# Patient Record
Sex: Female | Born: 1955 | Race: White | Hispanic: No | Smoking: Former smoker
Health system: Southern US, Community
[De-identification: ages and names within clinical notes are randomized; demographics above are authoritative.]

## PROBLEM LIST (undated history)

## (undated) DIAGNOSIS — K219 Gastro-esophageal reflux disease without esophagitis: Secondary | ICD-10-CM

## (undated) DIAGNOSIS — R7303 Prediabetes: Secondary | ICD-10-CM

## (undated) DIAGNOSIS — G4700 Insomnia, unspecified: Secondary | ICD-10-CM

## (undated) DIAGNOSIS — F419 Anxiety disorder, unspecified: Secondary | ICD-10-CM

## (undated) HISTORY — DX: Anxiety disorder, unspecified: F41.9

## (undated) HISTORY — PX: TONSILLECTOMY: SUR1361

## (undated) HISTORY — DX: Insomnia, unspecified: G47.00

## (undated) HISTORY — DX: Morbid (severe) obesity due to excess calories: E66.01

---

## 2000-10-01 ENCOUNTER — Other Ambulatory Visit: Admission: RE | Admit: 2000-10-01 | Discharge: 2000-10-01 | Payer: Self-pay | Admitting: Obstetrics and Gynecology

## 2000-11-25 ENCOUNTER — Encounter: Payer: Self-pay | Admitting: Family Medicine

## 2000-11-25 ENCOUNTER — Ambulatory Visit (HOSPITAL_COMMUNITY): Admission: RE | Admit: 2000-11-25 | Discharge: 2000-11-25 | Payer: Self-pay | Admitting: Family Medicine

## 2001-10-21 ENCOUNTER — Encounter: Payer: Self-pay | Admitting: Family Medicine

## 2001-10-21 ENCOUNTER — Ambulatory Visit (HOSPITAL_COMMUNITY): Admission: RE | Admit: 2001-10-21 | Discharge: 2001-10-21 | Payer: Self-pay | Admitting: Family Medicine

## 2004-03-03 ENCOUNTER — Ambulatory Visit (HOSPITAL_COMMUNITY): Admission: RE | Admit: 2004-03-03 | Discharge: 2004-03-03 | Payer: Self-pay | Admitting: Family Medicine

## 2004-05-12 ENCOUNTER — Ambulatory Visit (HOSPITAL_COMMUNITY): Admission: RE | Admit: 2004-05-12 | Discharge: 2004-05-12 | Payer: Self-pay | Admitting: Allergy and Immunology

## 2004-06-07 ENCOUNTER — Ambulatory Visit (HOSPITAL_COMMUNITY): Admission: RE | Admit: 2004-06-07 | Discharge: 2004-06-07 | Payer: Self-pay | Admitting: Family Medicine

## 2004-08-08 ENCOUNTER — Ambulatory Visit (HOSPITAL_COMMUNITY): Admission: RE | Admit: 2004-08-08 | Discharge: 2004-08-08 | Payer: Self-pay | Admitting: Family Medicine

## 2006-04-25 ENCOUNTER — Ambulatory Visit (HOSPITAL_COMMUNITY): Admission: RE | Admit: 2006-04-25 | Discharge: 2006-04-25 | Payer: Self-pay | Admitting: Family Medicine

## 2006-05-08 ENCOUNTER — Encounter: Admission: RE | Admit: 2006-05-08 | Discharge: 2006-05-08 | Payer: Self-pay | Admitting: Family Medicine

## 2007-10-16 ENCOUNTER — Ambulatory Visit (HOSPITAL_COMMUNITY): Admission: RE | Admit: 2007-10-16 | Discharge: 2007-10-16 | Payer: Self-pay | Admitting: Family Medicine

## 2009-02-16 ENCOUNTER — Ambulatory Visit (HOSPITAL_COMMUNITY): Admission: RE | Admit: 2009-02-16 | Discharge: 2009-02-16 | Payer: Self-pay | Admitting: Family Medicine

## 2010-02-17 ENCOUNTER — Ambulatory Visit (HOSPITAL_COMMUNITY): Admission: RE | Admit: 2010-02-17 | Discharge: 2010-02-17 | Payer: Self-pay | Admitting: Family Medicine

## 2011-04-04 ENCOUNTER — Other Ambulatory Visit (HOSPITAL_COMMUNITY): Payer: Self-pay | Admitting: Family Medicine

## 2011-04-04 DIAGNOSIS — Z139 Encounter for screening, unspecified: Secondary | ICD-10-CM

## 2011-04-17 ENCOUNTER — Ambulatory Visit (HOSPITAL_COMMUNITY)
Admission: RE | Admit: 2011-04-17 | Discharge: 2011-04-17 | Disposition: A | Discharge status: Home / Self Care | Payer: 59 | Source: Ambulatory Visit | Attending: Family Medicine | Admitting: Family Medicine

## 2011-04-17 DIAGNOSIS — Z139 Encounter for screening, unspecified: Secondary | ICD-10-CM

## 2011-04-17 DIAGNOSIS — Z1231 Encounter for screening mammogram for malignant neoplasm of breast: Secondary | ICD-10-CM | POA: Insufficient documentation

## 2011-04-24 ENCOUNTER — Other Ambulatory Visit: Payer: Self-pay | Admitting: Family Medicine

## 2011-04-24 DIAGNOSIS — R928 Other abnormal and inconclusive findings on diagnostic imaging of breast: Secondary | ICD-10-CM

## 2011-05-16 ENCOUNTER — Ambulatory Visit (HOSPITAL_COMMUNITY)
Admission: RE | Admit: 2011-05-16 | Discharge: 2011-05-16 | Disposition: A | Discharge status: Home / Self Care | Payer: 59 | Source: Ambulatory Visit | Attending: Family Medicine | Admitting: Family Medicine

## 2011-05-16 DIAGNOSIS — R928 Other abnormal and inconclusive findings on diagnostic imaging of breast: Secondary | ICD-10-CM | POA: Insufficient documentation

## 2011-08-02 ENCOUNTER — Telehealth: Payer: Self-pay

## 2011-08-02 NOTE — Telephone Encounter (Signed)
LMOM to call.

## 2011-08-07 NOTE — Telephone Encounter (Signed)
Called, LMOM to call. Will mail letter also.

## 2011-08-09 ENCOUNTER — Telehealth: Payer: Self-pay

## 2011-08-09 ENCOUNTER — Other Ambulatory Visit: Payer: Self-pay

## 2011-08-09 DIAGNOSIS — Z139 Encounter for screening, unspecified: Secondary | ICD-10-CM

## 2011-08-09 NOTE — Telephone Encounter (Signed)
Placed on schedule for colonoscopy on 08/17/2011 @ 1:15 PM. Orders in computer. Pt will need prescription and instructions on 08/16/2011 at her OV with Dr. Darrick Penna.

## 2011-08-09 NOTE — Telephone Encounter (Signed)
Pt says about once a month she has an episode with loose/yellowish stool. She requests to see Dr. Darrick Penna. I have scheduled OV with Dr. Darrick Penna on 08/16/2011 at 9:00 AM. Pt said she would like to have the colonoscopy the next day on 08/17/2011 if possible because of a point system at work.

## 2011-08-09 NOTE — Telephone Encounter (Signed)
Put pt on schedule for TCS 2/8

## 2011-08-14 ENCOUNTER — Encounter: Payer: Self-pay | Admitting: Gastroenterology

## 2011-08-14 ENCOUNTER — Encounter (HOSPITAL_COMMUNITY): Payer: Self-pay | Admitting: Pharmacy Technician

## 2011-08-16 ENCOUNTER — Encounter: Payer: Self-pay | Admitting: Gastroenterology

## 2011-08-16 ENCOUNTER — Ambulatory Visit (INDEPENDENT_AMBULATORY_CARE_PROVIDER_SITE_OTHER): Payer: 59 | Admitting: Gastroenterology

## 2011-08-16 VITALS — BP 140/82 | HR 63 | Temp 98.1°F | Ht 65.0 in | Wt 245.0 lb

## 2011-08-16 DIAGNOSIS — K591 Functional diarrhea: Secondary | ICD-10-CM

## 2011-08-16 MED ORDER — PEG-KCL-NACL-NASULF-NA ASC-C 100 G PO SOLR: 1.0000 | Freq: Once | ORAL | Status: DC

## 2011-08-16 MED ORDER — SODIUM CHLORIDE 0.45 % IV SOLN
Freq: Once | INTRAVENOUS | Status: AC
  Administered 2011-08-17: 13:00:00 via INTRAVENOUS

## 2011-08-16 NOTE — Progress Notes (Signed)
  Subjective:    Patient ID: Janet Luna, female    DOB: 10/03/1955, 55 y.o.   MRN: [id number]  PCP: [facility]  HPI NOV ATE LUNCH AROUND 1230 AND THEN AROUND 130 OR 2 PM WOULD HAVE EXPLOSIVE DIARRHEA. Started in NOV. Can get swimmy headed-NOV 27, DEC 14, JAN 22, & JAN 25-26. Can be associated with fever blisters. Doesn't know what causes it. "Could be her nerves". Yesterday ate cerael, nabs, & OJ. Came in from work ran to [employer]. cUP 1% MILK EVERY am, CHEESE: 1X/WEEK, ICE CREAM: NO. nO CRAMPS, NAUSEA, OR VOMITING. No blood in stool, black tarry stools, weight loss. Appetite: fine. No problems swallowing, or heartburn or indigestion. If she eats sweets or late at night she has acid reflux. Took ABx over a week ago: LEVAQUIN. None in in DEC or JAN. No IBS Dx. Sometime gets bad for anxiety-meds to sleep. No episode related to stress: watching over mother w/i last year and granddaughter since she was 2 mos old. WORKING HARD AT [employer]. NO BLOATING. In between episode nl formed stool. No joint pain or swollen joints. Problems with kidneys-if she sneezes, loses control of bladder. HAS HAD 3 KIDS: DAUGHTER AND TWIN BOYS.  Past Medical History  Diagnosis Date  . Allergic rhinitis   . Anxiety   . Insomnia   . Morbid obesity     Past Surgical History  Procedure Date  . Tonsillectomy     age 15  . Cesarean section     twin 28 yrs ago    No Known Allergies  Current Outpatient Prescriptions  Medication Sig Dispense Refill  . clorazepate (TRANXENE) 7.5 MG tablet Take 7.5 mg by mouth 2 (two) times daily.      . ibuprofen (ADVIL,MOTRIN) 200 MG tablet Take 400 mg by mouth every 6 (six) hours as needed. For pain      . traZODone (DESYREL) 100 MG tablet Take 100 mg by mouth at bedtime.       Family History  Problem Relation Age of Onset  . Colon cancer Neg Hx   . Colon polyps Neg Hx     History  Substance Use Topics  . Smoking status: Former Smoker -- 0.5 packs/day    Types: Cigarettes  .  Smokeless tobacco: Not on file   Comment: quit about 20 yrs ago  . Alcohol Use: No      Review of Systems  All other systems reviewed and are negative.       Objective:   Physical Exam  Vitals reviewed. Constitutional: She is oriented to person, place, and time. She appears well-developed. No distress.  HENT:  Head: Normocephalic and atraumatic.  Mouth/Throat: No oropharyngeal exudate.  Eyes: Pupils are equal, round, and reactive to light. No scleral icterus.  Neck: Normal range of motion. Neck supple.  Cardiovascular: Normal rate, regular rhythm and normal heart sounds.   Pulmonary/Chest: Effort normal and breath sounds normal. No respiratory distress.  Abdominal: Soft. Bowel sounds are normal. She exhibits no distension. There is no tenderness.  Musculoskeletal: Normal range of motion. She exhibits no edema.  Lymphadenopathy:    She has no cervical adenopathy.  Neurological: She is alert and oriented to person, place, and time.  Psychiatric:       SLIGHTLY ANXIOUS MOOD          Assessment & Plan:   

## 2011-08-16 NOTE — Assessment & Plan Note (Addendum)
DISCUSSED DIAGNOSIS, PROCEDURE, & BENEFITS.  WILL NEED COLON bX FOR MICROSCOPIC COLITIS. TCS FEB 8. OPV IN 6 MOS.  TOTAL TIME FOR h/pe/ & DISCUSSION OF PROCEDURE AND MANAGEMENT-60 MINS.

## 2011-08-17 ENCOUNTER — Encounter (HOSPITAL_COMMUNITY)
Admission: RE | Disposition: A | Discharge status: Home / Self Care | Payer: Self-pay | Source: Ambulatory Visit | Attending: Gastroenterology

## 2011-08-17 ENCOUNTER — Ambulatory Visit (HOSPITAL_COMMUNITY)
Admission: RE | Admit: 2011-08-17 | Discharge: 2011-08-17 | Disposition: A | Discharge status: Home / Self Care | Payer: 59 | Source: Ambulatory Visit | Attending: Gastroenterology | Admitting: Gastroenterology

## 2011-08-17 ENCOUNTER — Encounter (HOSPITAL_COMMUNITY): Payer: Self-pay | Admitting: *Deleted

## 2011-08-17 ENCOUNTER — Other Ambulatory Visit: Payer: Self-pay | Admitting: Gastroenterology

## 2011-08-17 DIAGNOSIS — K621 Rectal polyp: Secondary | ICD-10-CM

## 2011-08-17 DIAGNOSIS — K5730 Diverticulosis of large intestine without perforation or abscess without bleeding: Secondary | ICD-10-CM

## 2011-08-17 DIAGNOSIS — D128 Benign neoplasm of rectum: Secondary | ICD-10-CM | POA: Insufficient documentation

## 2011-08-17 DIAGNOSIS — D129 Benign neoplasm of anus and anal canal: Secondary | ICD-10-CM | POA: Insufficient documentation

## 2011-08-17 DIAGNOSIS — K648 Other hemorrhoids: Secondary | ICD-10-CM

## 2011-08-17 DIAGNOSIS — K620 Anal polyp: Secondary | ICD-10-CM

## 2011-08-17 DIAGNOSIS — Z139 Encounter for screening, unspecified: Secondary | ICD-10-CM

## 2011-08-17 DIAGNOSIS — Z1211 Encounter for screening for malignant neoplasm of colon: Secondary | ICD-10-CM

## 2011-08-17 HISTORY — PX: COLONOSCOPY: SHX5424

## 2011-08-17 HISTORY — DX: Gastro-esophageal reflux disease without esophagitis: K21.9

## 2011-08-17 SURGERY — COLONOSCOPY
Anesthesia: Moderate Sedation

## 2011-08-17 MED ORDER — MEPERIDINE HCL 100 MG/ML IJ SOLN
INTRAMUSCULAR | Status: DC | PRN
  Administered 2011-08-17 (×2): 25 mg via INTRAVENOUS
  Administered 2011-08-17: 50 mg via INTRAVENOUS

## 2011-08-17 MED ORDER — MIDAZOLAM HCL 5 MG/5ML IJ SOLN
INTRAMUSCULAR | Status: AC
  Filled 2011-08-17: qty 10

## 2011-08-17 MED ORDER — MIDAZOLAM HCL 5 MG/5ML IJ SOLN
INTRAMUSCULAR | Status: DC | PRN
  Administered 2011-08-17 (×2): 2 mg via INTRAVENOUS
  Administered 2011-08-17: 1 mg via INTRAVENOUS

## 2011-08-17 MED ORDER — STERILE WATER FOR IRRIGATION IR SOLN
Status: DC | PRN
  Administered 2011-08-17: 13:00:00

## 2011-08-17 MED ORDER — MEPERIDINE HCL 100 MG/ML IJ SOLN
INTRAMUSCULAR | Status: AC
  Filled 2011-08-17: qty 2

## 2011-08-17 NOTE — Progress Notes (Signed)
Faxed to PCP

## 2011-08-17 NOTE — Interval H&P Note (Signed)
History and Physical Interval Note:  08/17/2011 12:16 PM  Janet Luna  has presented today for surgery, with the diagnosis of Screening  The various methods of treatment have been discussed with the patient and family. After consideration of risks, benefits and other options for treatment, the patient has consented to  Procedure(s): COLONOSCOPY as a surgical intervention .  The patients' history has been reviewed, patient examined, no change in status, stable for surgery.  I have reviewed the patients' chart and labs.  Questions were answered to the patient's satisfaction.     [employer]

## 2011-08-17 NOTE — Op Note (Addendum)
[facility] [address] [place], [place]  [address]  COLONOSCOPY PROCEDURE REPORT  PATIENT:  Janet Luna, Janet Luna  MR#:  [id number] BIRTHDATE:  10-13-1955, 55 yrs. old  GENDER:  female  ENDOSCOPIST:  Jonette Eva, MD REF. BY:  Gareth Morgan, M.D. ASSISTANT:  PROCEDURE DATE:  08/17/2011 PROCEDURE:  ILEOColonoscopy with biopsy  INDICATIONS:  SCREENING  MEDICATIONS:   Demerol 100 mg IV, Versed 5 mg IV  DESCRIPTION OF PROCEDURE:    Physical exam was performed. Informed consent was obtained from the patient after explaining the benefits, risks, and alternatives to procedure.  The patient was connected to monitor and placed in left lateral position. Continuous oxygen was provided by nasal cannula and IV medicine administered through an indwelling cannula.  After administration of sedation and rectal exam, the patient's rectum was intubated and the EC-[id number] ([id number]) colonoscope was advanced under direct visualization to the ILEUM.  The scope was removed slowly by carefully examining the color, texture, anatomy, and integrity mucosa on the way out.  The patient was recovered in endoscopy and discharged home in satisfactory condition. <<PROCEDUREIMAGES>>  FINDINGS:  Scattered diverticula were found ascending colon to sigmoid colon.  SMALL Internal Hemorrhoids were found.  There were 3 SESSILE polyps identified and removed. in the rectum VIA COLD FORCEPS. RANDOM BIOPSIES OBTAINED VIA COLD FORCEPS TO EVALUATE FOR MICROSCOPIC OLITIS.  NL ILEUM  10 CM VISUALIZED.  PREP QUALITY: GOOD CECAL W/D TIME:    12 Minutes  COMPLICATIONS:    None  ENDOSCOPIC IMPRESSION: 1) Internal hemorrhoids 2) Polyps, multiple in the rectum 3) MILD PAN-COLONIC DIVERTICULOSIS  RECOMMENDATIONS: TCS IN 10 YEARS HIGH FIBER DIET AWAIT BIOPSIES  REPEAT EXAM:  No  ______________________________ Jonette Eva, MD  CC:  Gareth Morgan, M.D.  n. REVISED:  08/30/2011 01:25 PM eSIGNED:   Duncan Dull  Fields at 08/30/2011 01:25 PM  Charisse March, [phone]

## 2011-08-17 NOTE — H&P (View-Only) (Signed)
  Subjective:    Patient ID: Janet Luna, female    DOB: 17-Aug-1955, 56 y.o.   MRN: [id number]  PCP: [facility]  HPI NOV ATE LUNCH AROUND 1230 AND THEN AROUND 130 OR 2 PM WOULD HAVE EXPLOSIVE DIARRHEA. Started in NOV. Can get swimmy headed-NOV 27, DEC 14, JAN 22, & JAN 25-26. Can be associated with fever blisters. Doesn't know what causes it. "Could be her nerves". Yesterday ate cerael, nabs, & OJ. Came in from work ran to [employer]. cUP 1% MILK EVERY am, CHEESE: 1X/WEEK, ICE CREAM: NO. nO CRAMPS, NAUSEA, OR VOMITING. No blood in stool, black tarry stools, weight loss. Appetite: fine. No problems swallowing, or heartburn or indigestion. If she eats sweets or late at night she has acid reflux. Took ABx over a week ago: LEVAQUIN. None in in DEC or JAN. No IBS Dx. Sometime gets bad for anxiety-meds to sleep. No episode related to stress: watching over mother w/i last year and granddaughter since she was 29 mos old. WORKING HARD AT [employer]. NO BLOATING. In between episode nl formed stool. No joint pain or swollen joints. Problems with kidneys-if she sneezes, loses control of bladder. HAS HAD 3 KIDS: DAUGHTER AND TWIN BOYS.  Past Medical History  Diagnosis Date  . Allergic rhinitis   . Anxiety   . Insomnia   . Morbid obesity     Past Surgical History  Procedure Date  . Tonsillectomy     age 65  . Cesarean section     twin 28 yrs ago    No Known Allergies  Current Outpatient Prescriptions  Medication Sig Dispense Refill  . clorazepate (TRANXENE) 7.5 MG tablet Take 7.5 mg by mouth 2 (two) times daily.      Marland Kitchen ibuprofen (ADVIL,MOTRIN) 200 MG tablet Take 400 mg by mouth every 6 (six) hours as needed. For pain      . traZODone (DESYREL) 100 MG tablet Take 100 mg by mouth at bedtime.       Family History  Problem Relation Age of Onset  . Colon cancer Neg Hx   . Colon polyps Neg Hx     History  Substance Use Topics  . Smoking status: Former Smoker -- 0.5 packs/day    Types: Cigarettes  .  Smokeless tobacco: Not on file   Comment: quit about 20 yrs ago  . Alcohol Use: No      Review of Systems  All other systems reviewed and are negative.       Objective:   Physical Exam  Vitals reviewed. Constitutional: She is oriented to person, place, and time. She appears well-developed. No distress.  HENT:  Head: Normocephalic and atraumatic.  Mouth/Throat: No oropharyngeal exudate.  Eyes: Pupils are equal, round, and reactive to light. No scleral icterus.  Neck: Normal range of motion. Neck supple.  Cardiovascular: Normal rate, regular rhythm and normal heart sounds.   Pulmonary/Chest: Effort normal and breath sounds normal. No respiratory distress.  Abdominal: Soft. Bowel sounds are normal. She exhibits no distension. There is no tenderness.  Musculoskeletal: Normal range of motion. She exhibits no edema.  Lymphadenopathy:    She has no cervical adenopathy.  Neurological: She is alert and oriented to person, place, and time.  Psychiatric:       SLIGHTLY ANXIOUS MOOD          Assessment & Plan:

## 2011-08-23 ENCOUNTER — Telehealth: Payer: Self-pay | Admitting: Gastroenterology

## 2011-08-23 NOTE — Telephone Encounter (Signed)
Please call pt. She had HYPERPLASTIC POLYPS removed from her colon. TCS in 10 years. High fiber diet.  

## 2011-08-23 NOTE — Telephone Encounter (Signed)
Results Cc to PCP & 10yr reminder is in the computer 

## 2011-08-23 NOTE — Telephone Encounter (Signed)
LMOM to call.

## 2011-08-27 ENCOUNTER — Encounter (HOSPITAL_COMMUNITY): Payer: Self-pay | Admitting: Gastroenterology

## 2011-08-27 NOTE — Telephone Encounter (Signed)
LMOM to call.

## 2011-08-27 NOTE — Telephone Encounter (Signed)
Pt returned call and was informed.  

## 2011-08-27 NOTE — Progress Notes (Signed)
Reminder in epic to follow up in 6 months with SF in E30 °

## 2012-02-07 ENCOUNTER — Encounter: Payer: Self-pay | Admitting: Gastroenterology

## 2012-11-04 DIAGNOSIS — Z029 Encounter for administrative examinations, unspecified: Secondary | ICD-10-CM

## 2012-12-15 ENCOUNTER — Other Ambulatory Visit (HOSPITAL_COMMUNITY): Payer: Self-pay | Admitting: Family Medicine

## 2012-12-15 DIAGNOSIS — Z139 Encounter for screening, unspecified: Secondary | ICD-10-CM

## 2012-12-18 ENCOUNTER — Ambulatory Visit (HOSPITAL_COMMUNITY)
Admission: RE | Admit: 2012-12-18 | Discharge: 2012-12-18 | Disposition: A | Discharge status: Home / Self Care | Payer: 59 | Source: Ambulatory Visit | Attending: Family Medicine | Admitting: Family Medicine

## 2012-12-18 DIAGNOSIS — Z1231 Encounter for screening mammogram for malignant neoplasm of breast: Secondary | ICD-10-CM | POA: Insufficient documentation

## 2012-12-18 DIAGNOSIS — Z139 Encounter for screening, unspecified: Secondary | ICD-10-CM

## 2013-05-25 ENCOUNTER — Encounter (HOSPITAL_COMMUNITY): Payer: Self-pay | Admitting: Emergency Medicine

## 2013-05-25 ENCOUNTER — Emergency Department (HOSPITAL_COMMUNITY)
Admission: EM | Admit: 2013-05-25 | Discharge: 2013-05-25 | Disposition: A | Discharge status: Home / Self Care | Payer: 59 | Attending: Emergency Medicine | Admitting: Emergency Medicine

## 2013-05-25 DIAGNOSIS — R51 Headache: Secondary | ICD-10-CM | POA: Insufficient documentation

## 2013-05-25 DIAGNOSIS — Z87891 Personal history of nicotine dependence: Secondary | ICD-10-CM | POA: Insufficient documentation

## 2013-05-25 DIAGNOSIS — R0789 Other chest pain: Secondary | ICD-10-CM | POA: Insufficient documentation

## 2013-05-25 DIAGNOSIS — R490 Dysphonia: Secondary | ICD-10-CM | POA: Insufficient documentation

## 2013-05-25 DIAGNOSIS — Z8719 Personal history of other diseases of the digestive system: Secondary | ICD-10-CM | POA: Insufficient documentation

## 2013-05-25 DIAGNOSIS — L299 Pruritus, unspecified: Secondary | ICD-10-CM | POA: Insufficient documentation

## 2013-05-25 DIAGNOSIS — F411 Generalized anxiety disorder: Secondary | ICD-10-CM | POA: Insufficient documentation

## 2013-05-25 DIAGNOSIS — G4700 Insomnia, unspecified: Secondary | ICD-10-CM | POA: Insufficient documentation

## 2013-05-25 DIAGNOSIS — T7840XA Allergy, unspecified, initial encounter: Secondary | ICD-10-CM

## 2013-05-25 DIAGNOSIS — R197 Diarrhea, unspecified: Secondary | ICD-10-CM | POA: Insufficient documentation

## 2013-05-25 DIAGNOSIS — T3695XA Adverse effect of unspecified systemic antibiotic, initial encounter: Secondary | ICD-10-CM | POA: Insufficient documentation

## 2013-05-25 DIAGNOSIS — Z79899 Other long term (current) drug therapy: Secondary | ICD-10-CM | POA: Insufficient documentation

## 2013-05-25 NOTE — ED Notes (Signed)
After taking Levoquin today began having itching on top of her head and chest pressure. Has had history of reaction to Avelox in the past, resulting in hives which were controlled w/PO Benadryl.  States HA from earlier is relieved along w/itching at present; however, states her chest "feels funny".  Voice is hoarse.  Patient denies and cough, sore throat or post nasal drip.

## 2013-05-25 NOTE — ED Notes (Signed)
Pt states she was recently placed on Levaquin for sinus infection. States she took two and stopped (filled 11/5). States headache all week so she took another Levaquin this morning along with two get advil. Pt states she began itching to the scalp and tight sensation to throat area. States it is feeling a little better at present. NAD.

## 2013-05-25 NOTE — ED Provider Notes (Signed)
CSN: [id number]     Arrival date & time 05/25/13  1038 History  This chart was scribed for Joya Gaskins, MD by Bennett Scrape, ED Scribe. This patient was seen in room [facility]06[facility] and the patient's care was started at 12:01 PM.   Chief Complaint  Patient presents with  . Allergic Reaction    Patient is a 57 y.o. female presenting with allergic reaction. The history is provided by the patient. No language interpreter was used.  Allergic Reaction Presenting symptoms: itching   Presenting symptoms: no difficulty swallowing and no rash   Severity:  Mild Prior allergic episodes:  No prior episodes Relieved by:  Antihistamines Worsened by:  Nothing tried Ineffective treatments:  None tried   HPI Comments: Janet Luna is a 57 y.o. female who presents to the Emergency Department complaining of a sudden onset, gradually improving allergic reaction to Levaquin that started 10 minutes after taking it. Pt states that she was recently placed on Levaquin for a sinus infection. She got the medication filled on 05/13/13 and took two doses. She stopped due to having diarrhea which she attributes to IBS. She then resumed her Levaquin doses this morning coupled with two Advil gel tabs when she developed scalp itching, chest tightness and hoarse voice which have since improved with 25 mg Benadryl. She has taken Levaquin before with no problems and denies any new exposures. She also reports a HA for the past few days. She states that it is dull currently and improved. She denies any extremity weakness or numbness, cough, rhinorrhea or visual changes. She reports a prior colonoscopy several years ago but has been having recurrent diarrhea.    Past Medical History  Diagnosis Date  . Allergic rhinitis   . Anxiety   . Insomnia   . Morbid obesity   . GERD (gastroesophageal reflux disease)    Past Surgical History  Procedure Laterality Date  . Tonsillectomy      age 66  . Cesarean section      twin  28 yrs ago  . Colonoscopy  08/17/2011    Procedure: COLONOSCOPY;  Surgeon: Arlyce Harman, MD;  Location: [facility];  Service: Endoscopy;  Laterality: N/A;  1:15 PM   Family History  Problem Relation Age of Onset  . Colon cancer Neg Hx   . Colon polyps Neg Hx    History  Substance Use Topics  . Smoking status: Former Smoker -- 0.50 packs/day for 12 years    Types: Cigarettes  . Smokeless tobacco: Not on file     Comment: quit about 20 yrs ago  . Alcohol Use: No   No OB history provided.  Review of Systems  HENT: Positive for voice change. Negative for rhinorrhea and trouble swallowing.   Respiratory: Negative for cough and shortness of breath.   Gastrointestinal: Positive for diarrhea. Negative for abdominal pain.  Skin: Positive for itching. Negative for rash.  Neurological: Positive for headaches.  All other systems reviewed and are negative.    Allergies  Avelox  Home Medications   Current Outpatient Rx  Name  Route  Sig  Dispense  Refill  . clorazepate (TRANXENE) 7.5 MG tablet   Oral   Take 7.5 mg by mouth 2 (two) times daily.         Marland Kitchen ibuprofen (ADVIL,MOTRIN) 200 MG tablet   Oral   Take 400 mg by mouth every 6 (six) hours as needed. For pain         .  traZODone (DESYREL) 100 MG tablet   Oral   Take 100 mg by mouth at bedtime.          Triage Vitals: BP 154/74  Pulse 64  Temp(Src) 98.4 F (36.9 C) (Oral)  Resp 20  Ht 5\' 4"  (1.626 m)  Wt 230 lb (104.327 kg)  BMI 39.46 kg/m2  SpO2 100%  Physical Exam  Nursing note and vitals reviewed.  CONSTITUTIONAL: Well developed/well nourished HEAD: Normocephalic/atraumatic, no angioedema, no tongue elevation, mild amount of erythema to left cheek, handling secretions EYES: EOMI/PERRL ENMT: Mucous membranes moist, no stridor is noted NECK: supple no meningeal signs CV: S1/S2 noted, no murmurs/rubs/gallops noted LUNGS: Lungs are clear to auscultation bilaterally, no wheezing, no apparent  distress ABDOMEN: soft, nontender, no rebound or guarding NEURO: Pt is awake/alert, moves all extremitiesx4 EXTREMITIES: pulses normal, full ROM SKIN: warm, color normal, no rash noted PSYCH: no abnormalities of mood noted  ED Course  Procedures (including critical care time)  DIAGNOSTIC STUDIES: Oxygen Saturation is 100% on room air, normal by my interpretation.    COORDINATION OF CARE: 11:29 AM-Discussed treatment plan which includes benadryl with pt at bedside and pt agreed to plan. Advised pt to stop the Levaquin and to f/u with PCP for her other symptoms. Pt with mild reaction Stable for d/c home    EKG Interpretation   None       MDM  No diagnosis found. Nursing notes including past medical history and social history reviewed and considered in documentation   I personally performed the services described in this documentation, which was scribed in my presence. The recorded information has been reviewed and is accurate.      Joya Gaskins, MD 05/25/13 [phone]

## 2013-11-11 ENCOUNTER — Other Ambulatory Visit (HOSPITAL_COMMUNITY): Payer: Self-pay | Admitting: Family Medicine

## 2013-11-11 DIAGNOSIS — I8000 Phlebitis and thrombophlebitis of superficial vessels of unspecified lower extremity: Secondary | ICD-10-CM

## 2013-11-12 ENCOUNTER — Ambulatory Visit (HOSPITAL_COMMUNITY)
Admission: RE | Admit: 2013-11-12 | Discharge: 2013-11-12 | Disposition: A | Discharge status: Home / Self Care | Payer: 59 | Source: Ambulatory Visit | Attending: Family Medicine | Admitting: Family Medicine

## 2013-11-12 DIAGNOSIS — I8000 Phlebitis and thrombophlebitis of superficial vessels of unspecified lower extremity: Secondary | ICD-10-CM

## 2013-12-08 ENCOUNTER — Other Ambulatory Visit (HOSPITAL_COMMUNITY): Payer: Self-pay | Admitting: Family Medicine

## 2013-12-08 DIAGNOSIS — Z1231 Encounter for screening mammogram for malignant neoplasm of breast: Secondary | ICD-10-CM

## 2013-12-21 ENCOUNTER — Ambulatory Visit (HOSPITAL_COMMUNITY): Payer: 59

## 2014-04-21 ENCOUNTER — Ambulatory Visit (HOSPITAL_COMMUNITY)
Admission: RE | Admit: 2014-04-21 | Discharge: 2014-04-21 | Disposition: A | Discharge status: Home / Self Care | Payer: BC Managed Care – PPO | Source: Ambulatory Visit | Attending: Family Medicine | Admitting: Family Medicine

## 2014-04-21 DIAGNOSIS — Z1231 Encounter for screening mammogram for malignant neoplasm of breast: Secondary | ICD-10-CM | POA: Diagnosis not present

## 2015-04-25 ENCOUNTER — Other Ambulatory Visit (HOSPITAL_COMMUNITY): Payer: Self-pay | Admitting: Family Medicine

## 2015-04-25 DIAGNOSIS — Z1231 Encounter for screening mammogram for malignant neoplasm of breast: Secondary | ICD-10-CM

## 2015-04-27 ENCOUNTER — Ambulatory Visit (HOSPITAL_COMMUNITY)
Admission: RE | Admit: 2015-04-27 | Discharge: 2015-04-27 | Disposition: A | Discharge status: Home / Self Care | Payer: BLUE CROSS/BLUE SHIELD | Source: Ambulatory Visit | Attending: Family Medicine | Admitting: Family Medicine

## 2015-04-27 DIAGNOSIS — Z1231 Encounter for screening mammogram for malignant neoplasm of breast: Secondary | ICD-10-CM | POA: Diagnosis present

## 2015-05-03 ENCOUNTER — Other Ambulatory Visit: Payer: Self-pay | Admitting: Family Medicine

## 2015-05-03 ENCOUNTER — Other Ambulatory Visit (HOSPITAL_COMMUNITY): Payer: Self-pay | Admitting: Family Medicine

## 2015-05-03 DIAGNOSIS — IMO0002 Reserved for concepts with insufficient information to code with codable children: Secondary | ICD-10-CM

## 2015-05-03 DIAGNOSIS — R229 Localized swelling, mass and lump, unspecified: Principal | ICD-10-CM

## 2015-05-03 DIAGNOSIS — R928 Other abnormal and inconclusive findings on diagnostic imaging of breast: Secondary | ICD-10-CM

## 2015-05-10 ENCOUNTER — Ambulatory Visit
Admission: RE | Admit: 2015-05-10 | Discharge: 2015-05-10 | Disposition: A | Discharge status: Home / Self Care | Payer: BLUE CROSS/BLUE SHIELD | Source: Ambulatory Visit | Attending: Family Medicine | Admitting: Family Medicine

## 2015-05-10 DIAGNOSIS — R928 Other abnormal and inconclusive findings on diagnostic imaging of breast: Secondary | ICD-10-CM

## 2016-04-23 ENCOUNTER — Other Ambulatory Visit (HOSPITAL_COMMUNITY): Payer: Self-pay | Admitting: Family Medicine

## 2016-04-23 DIAGNOSIS — Z1231 Encounter for screening mammogram for malignant neoplasm of breast: Secondary | ICD-10-CM

## 2016-04-27 ENCOUNTER — Ambulatory Visit (HOSPITAL_COMMUNITY)
Admission: RE | Admit: 2016-04-27 | Discharge: 2016-04-27 | Disposition: A | Discharge status: Home / Self Care | Payer: BLUE CROSS/BLUE SHIELD | Source: Ambulatory Visit | Attending: Family Medicine | Admitting: Family Medicine

## 2016-04-27 DIAGNOSIS — Z1231 Encounter for screening mammogram for malignant neoplasm of breast: Secondary | ICD-10-CM | POA: Diagnosis not present

## 2018-12-15 ENCOUNTER — Other Ambulatory Visit (HOSPITAL_COMMUNITY): Payer: Self-pay | Admitting: Family Medicine

## 2018-12-15 DIAGNOSIS — Z1231 Encounter for screening mammogram for malignant neoplasm of breast: Secondary | ICD-10-CM

## 2018-12-18 ENCOUNTER — Ambulatory Visit (HOSPITAL_COMMUNITY)
Admission: RE | Admit: 2018-12-18 | Discharge: 2018-12-18 | Disposition: A | Discharge status: Home / Self Care | Payer: Medicare Other | Source: Ambulatory Visit | Attending: Family Medicine | Admitting: Family Medicine

## 2018-12-18 ENCOUNTER — Other Ambulatory Visit: Payer: Self-pay

## 2018-12-18 DIAGNOSIS — Z1231 Encounter for screening mammogram for malignant neoplasm of breast: Secondary | ICD-10-CM | POA: Diagnosis present

## 2018-12-19 ENCOUNTER — Ambulatory Visit (HOSPITAL_COMMUNITY): Admission: RE | Admit: 2018-12-19 | Payer: Self-pay | Source: Ambulatory Visit

## 2020-08-08 ENCOUNTER — Other Ambulatory Visit (HOSPITAL_COMMUNITY): Payer: Self-pay | Admitting: Family Medicine

## 2020-08-08 DIAGNOSIS — Z1231 Encounter for screening mammogram for malignant neoplasm of breast: Secondary | ICD-10-CM

## 2020-08-26 ENCOUNTER — Other Ambulatory Visit: Payer: Self-pay

## 2020-08-26 ENCOUNTER — Ambulatory Visit (HOSPITAL_COMMUNITY)
Admission: RE | Admit: 2020-08-26 | Discharge: 2020-08-26 | Disposition: A | Discharge status: Home / Self Care | Payer: Medicare Other | Source: Ambulatory Visit | Attending: Family Medicine | Admitting: Family Medicine

## 2020-08-26 DIAGNOSIS — Z1231 Encounter for screening mammogram for malignant neoplasm of breast: Secondary | ICD-10-CM | POA: Diagnosis present

## 2020-08-31 ENCOUNTER — Ambulatory Visit (HOSPITAL_COMMUNITY)
Admission: RE | Admit: 2020-08-31 | Discharge: 2020-08-31 | Disposition: A | Discharge status: Home / Self Care | Payer: Medicare Other | Source: Ambulatory Visit | Attending: Family Medicine | Admitting: Family Medicine

## 2020-08-31 ENCOUNTER — Other Ambulatory Visit: Payer: Self-pay | Admitting: Family Medicine

## 2020-08-31 ENCOUNTER — Other Ambulatory Visit (HOSPITAL_COMMUNITY): Payer: Self-pay | Admitting: Family Medicine

## 2020-08-31 ENCOUNTER — Other Ambulatory Visit: Payer: Self-pay

## 2020-08-31 DIAGNOSIS — M79662 Pain in left lower leg: Secondary | ICD-10-CM

## 2020-08-31 DIAGNOSIS — M7989 Other specified soft tissue disorders: Secondary | ICD-10-CM | POA: Diagnosis present

## 2020-09-13 ENCOUNTER — Other Ambulatory Visit (HOSPITAL_COMMUNITY): Payer: Self-pay | Admitting: Family Medicine

## 2020-09-13 ENCOUNTER — Other Ambulatory Visit: Payer: Self-pay | Admitting: Family Medicine

## 2020-09-13 ENCOUNTER — Other Ambulatory Visit: Payer: Self-pay

## 2020-09-13 ENCOUNTER — Ambulatory Visit (HOSPITAL_COMMUNITY)
Admission: RE | Admit: 2020-09-13 | Discharge: 2020-09-13 | Disposition: A | Discharge status: Home / Self Care | Payer: Medicare Other | Source: Ambulatory Visit | Attending: Family Medicine | Admitting: Family Medicine

## 2020-09-13 DIAGNOSIS — M7989 Other specified soft tissue disorders: Secondary | ICD-10-CM | POA: Diagnosis present

## 2020-09-13 DIAGNOSIS — M79662 Pain in left lower leg: Secondary | ICD-10-CM | POA: Insufficient documentation

## 2020-12-09 ENCOUNTER — Other Ambulatory Visit (HOSPITAL_COMMUNITY): Payer: Self-pay | Admitting: Family Medicine

## 2020-12-09 DIAGNOSIS — I8002 Phlebitis and thrombophlebitis of superficial vessels of left lower extremity: Secondary | ICD-10-CM

## 2020-12-14 ENCOUNTER — Ambulatory Visit (HOSPITAL_COMMUNITY): Payer: Medicare Other

## 2020-12-15 ENCOUNTER — Ambulatory Visit (HOSPITAL_COMMUNITY)
Admission: RE | Admit: 2020-12-15 | Discharge: 2020-12-15 | Disposition: A | Discharge status: Home / Self Care | Payer: Medicare Other | Source: Ambulatory Visit | Attending: Family Medicine | Admitting: Family Medicine

## 2020-12-15 DIAGNOSIS — I8002 Phlebitis and thrombophlebitis of superficial vessels of left lower extremity: Secondary | ICD-10-CM | POA: Diagnosis not present

## 2021-08-16 ENCOUNTER — Encounter: Payer: Self-pay | Admitting: *Deleted

## 2021-10-16 ENCOUNTER — Other Ambulatory Visit (HOSPITAL_COMMUNITY): Payer: Self-pay | Admitting: Family Medicine

## 2021-10-16 DIAGNOSIS — Z1231 Encounter for screening mammogram for malignant neoplasm of breast: Secondary | ICD-10-CM

## 2021-10-18 ENCOUNTER — Ambulatory Visit (HOSPITAL_COMMUNITY)
Admission: RE | Admit: 2021-10-18 | Discharge: 2021-10-18 | Disposition: A | Discharge status: Home / Self Care | Payer: Medicare Other | Source: Ambulatory Visit | Attending: Family Medicine | Admitting: Family Medicine

## 2021-10-18 ENCOUNTER — Encounter: Payer: Self-pay | Admitting: *Deleted

## 2021-10-18 DIAGNOSIS — Z1231 Encounter for screening mammogram for malignant neoplasm of breast: Secondary | ICD-10-CM | POA: Diagnosis present

## 2021-12-13 ENCOUNTER — Encounter: Payer: Self-pay | Admitting: *Deleted

## 2021-12-19 ENCOUNTER — Ambulatory Visit: Payer: Medicare Other

## 2022-06-12 ENCOUNTER — Encounter: Payer: Self-pay | Admitting: *Deleted

## 2022-10-05 ENCOUNTER — Other Ambulatory Visit (HOSPITAL_COMMUNITY): Payer: Self-pay | Admitting: Family Medicine

## 2022-10-05 DIAGNOSIS — Z1231 Encounter for screening mammogram for malignant neoplasm of breast: Secondary | ICD-10-CM

## 2022-10-09 ENCOUNTER — Encounter: Payer: Self-pay | Admitting: *Deleted

## 2022-10-26 ENCOUNTER — Ambulatory Visit (HOSPITAL_COMMUNITY)
Admission: RE | Admit: 2022-10-26 | Discharge: 2022-10-26 | Disposition: A | Discharge status: Home / Self Care | Payer: Medicare Other | Source: Ambulatory Visit | Attending: Family Medicine | Admitting: Family Medicine

## 2022-10-26 DIAGNOSIS — Z1231 Encounter for screening mammogram for malignant neoplasm of breast: Secondary | ICD-10-CM | POA: Diagnosis present

## 2022-11-19 ENCOUNTER — Telehealth: Payer: Self-pay | Admitting: *Deleted

## 2022-11-19 NOTE — Telephone Encounter (Signed)
  Procedure: Colonoscopy  Height: 5'4 Weight: 244lb        Have you had a colonoscopy before?  08/17/11, Dr. Darrick Penna  Do you have family history of colon cancer?  no  Do you have a family history of polyps? no  Previous colonoscopy with polyps removed? yes  Do you have a history colorectal cancer?   no  Are you diabetic?  no  Do you have a prosthetic or mechanical heart valve? no  Do you have a pacemaker/defibrillator?   no  Have you had endocarditis/atrial fibrillation?  no  Do you use supplemental oxygen/CPAP?  no  Have you had joint replacement within the last 12 months?  no  Do you tend to be constipated or have to use laxatives?  no   Do you have history of alcohol use? If yes, how much and how often.  no  Do you have history or are you using drugs? If yes, what do are you  using?  no  Have you ever had a stroke/heart attack?  no  Have you ever had a heart or other vascular stent placed,?no  Do you take weight loss medication? no  female patients,: have you had a hysterectomy? no                              are you post menopausal?  no                              do you still have your menstrual cycle? no    Date of last menstrual period?   Do you take any blood-thinning medications such as: (Plavix, aspirin, Coumadin, Aggrenox, Brilinta, Xarelto, Eliquis, Pradaxa, Savaysa or Effient)? no  If yes we need the name, milligram, dosage and who is prescribing doctor:               Current Outpatient Medications  Medication Sig Dispense Refill   clonazePAM (KLONOPIN) 0.5 MG tablet Take 0.5 mg by mouth in the morning, at noon, and at bedtime.     Cyanocobalamin (B-12 PO) Take by mouth. 2 daily     Famotidine (PEPCID PO) Take by mouth as needed.     Ferrous Sulfate (IRON PO) Take by mouth daily.     ibuprofen (ADVIL,MOTRIN) 200 MG tablet Take 400 mg by mouth every 6 (six) hours as needed. For pain     traZODone (DESYREL) 100 MG tablet Take 100 mg by mouth at bedtime.      No current facility-administered medications for this visit.    Allergies  Allergen Reactions   Quinolones Hives   Avelox [Moxifloxacin Hcl In Nacl] Hives   Levaquin [Levofloxacin In D5w]

## 2022-11-20 ENCOUNTER — Other Ambulatory Visit: Payer: Self-pay | Admitting: *Deleted

## 2022-11-20 ENCOUNTER — Encounter: Payer: Self-pay | Admitting: *Deleted

## 2022-11-20 MED ORDER — PEG 3350-KCL-NA BICARB-NACL 420 G PO SOLR: 4000.0000 mL | Freq: Once | ORAL | 0 refills | Status: AC

## 2022-11-20 NOTE — Telephone Encounter (Signed)
Pt has been scheduled for 12/27/22 with Dr.Carver. instructions mailed and prep sent to the pharmacy

## 2022-11-20 NOTE — Telephone Encounter (Signed)
LMTRC

## 2022-11-20 NOTE — Telephone Encounter (Signed)
Okay to schedule. ASA 3 due to BMI.  Hold iron x 7 days prior to procedure.

## 2022-11-21 ENCOUNTER — Encounter: Payer: Self-pay | Admitting: *Deleted

## 2022-11-21 NOTE — Telephone Encounter (Signed)
Referral completed

## 2022-11-22 ENCOUNTER — Encounter: Payer: Self-pay | Admitting: *Deleted

## 2022-11-27 IMAGING — MG MM DIGITAL SCREENING BILAT W/ TOMO AND CAD
8 series · 8 of 24 positions shown · non-contrast
Comparison: Previous exam(s).

CLINICAL DATA: Screening.

EXAM:
DIGITAL SCREENING BILATERAL MAMMOGRAM WITH TOMOSYNTHESIS AND CAD
TECHNIQUE: Bilateral screening digital craniocaudal and mediolateral oblique
mammograms were obtained. Bilateral screening digital breast
tomosynthesis was performed. The images were evaluated with
computer-aided detection.

[R CC synth-2D]
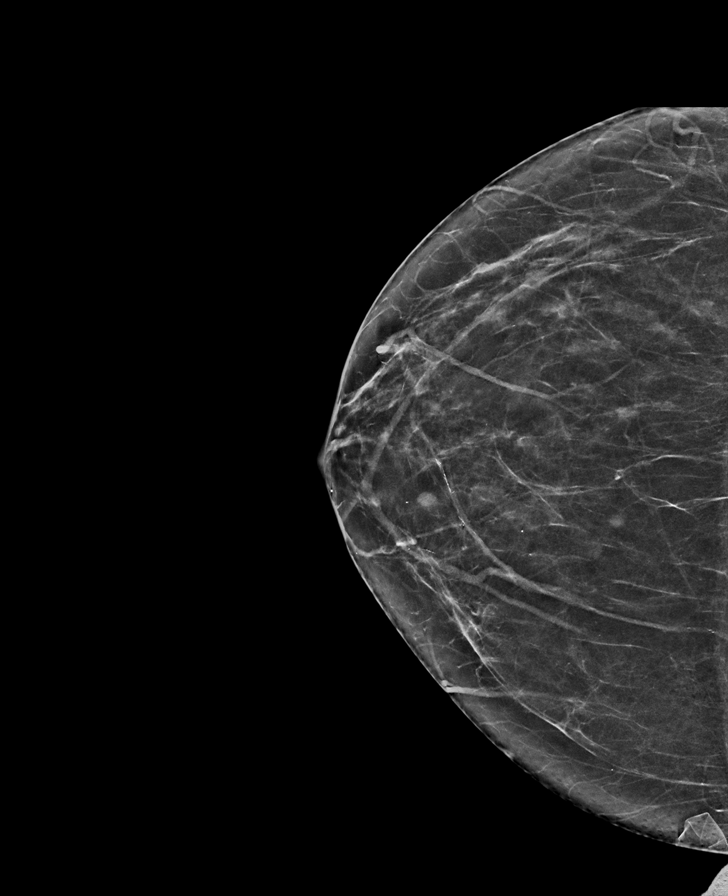

[R MLO synth-2D]
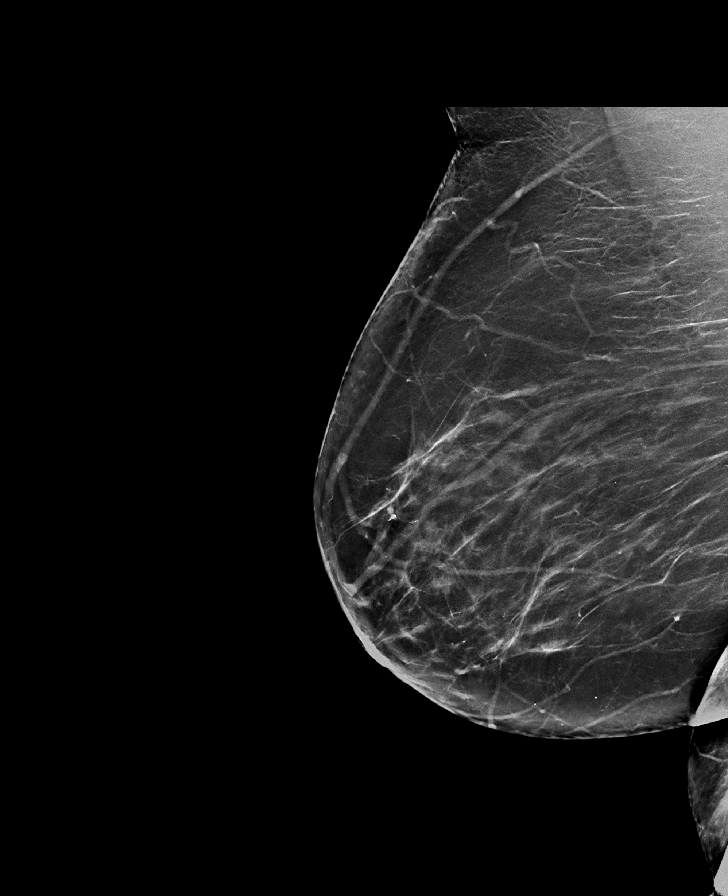

[L MLO synth-2D]
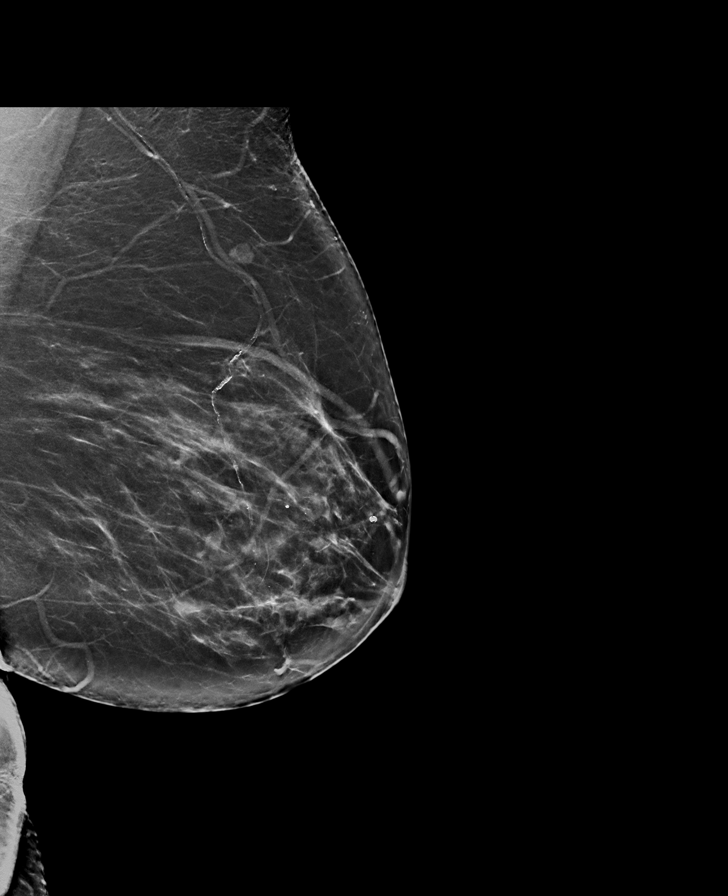

[L CC synth-2D]
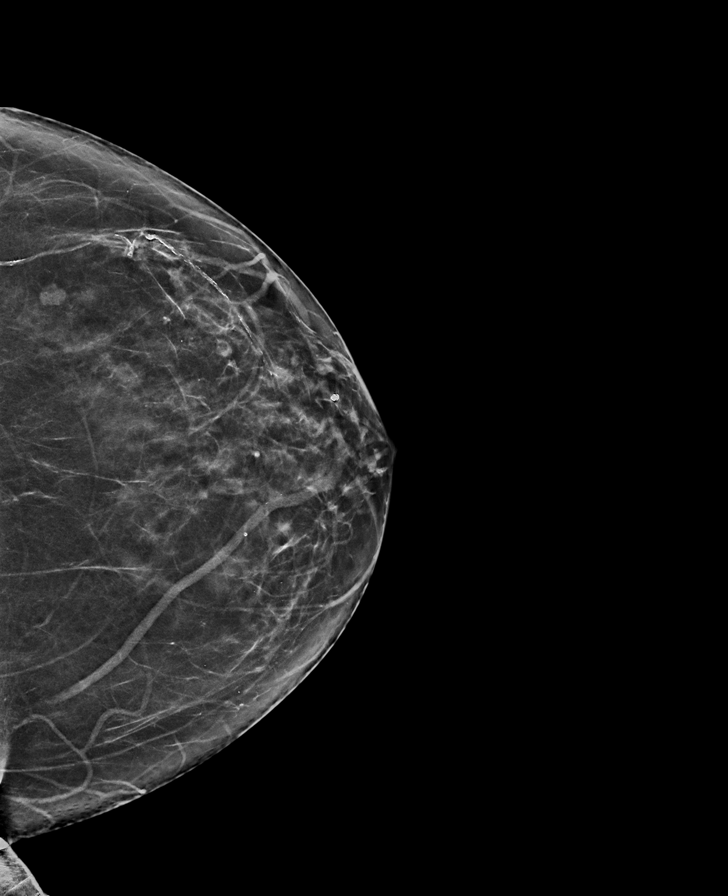

[L CC tomo · tomo slice 34/67.0]
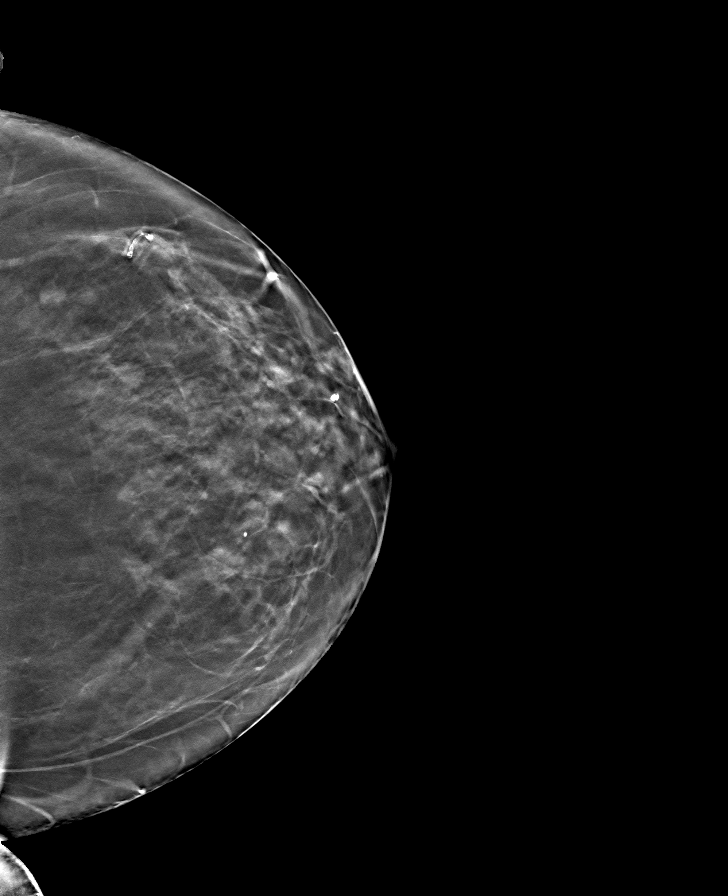

[L MLO tomo · tomo slice 41/80.0]
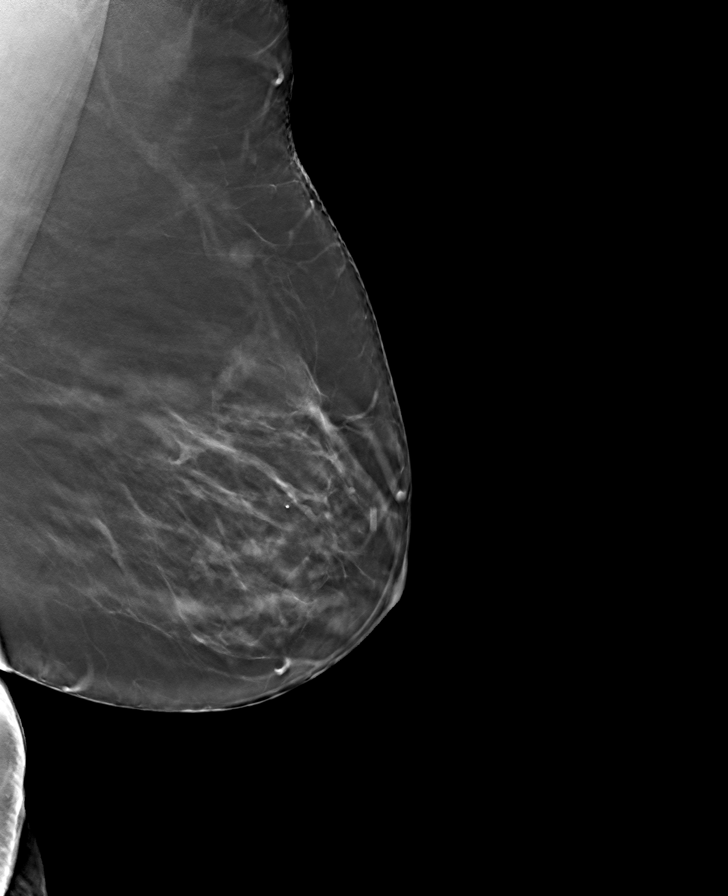

[R MLO tomo · tomo slice 42/83.0]
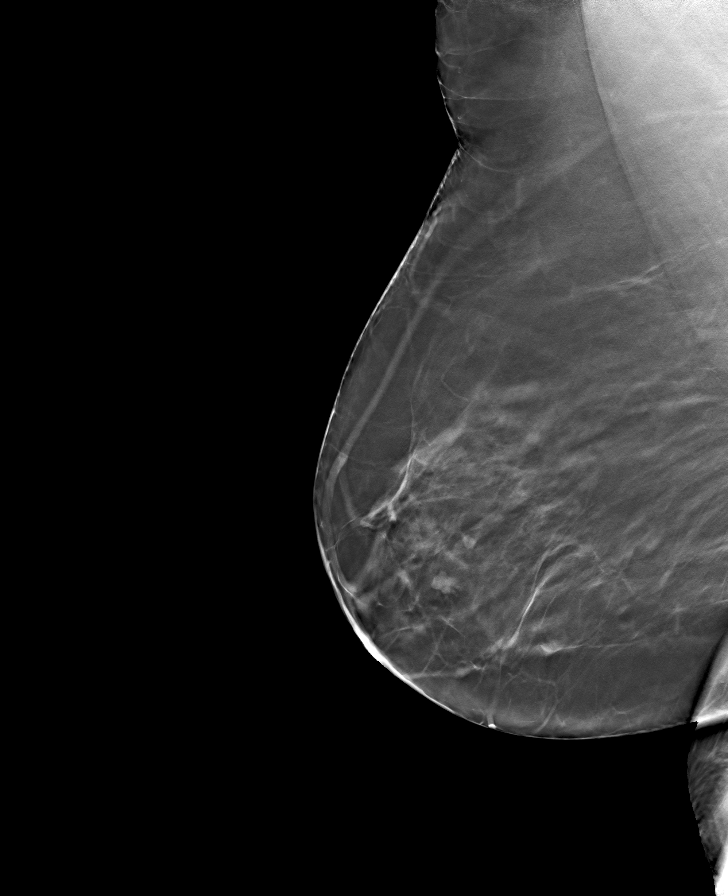

[R CC tomo · tomo slice 31/61.0]
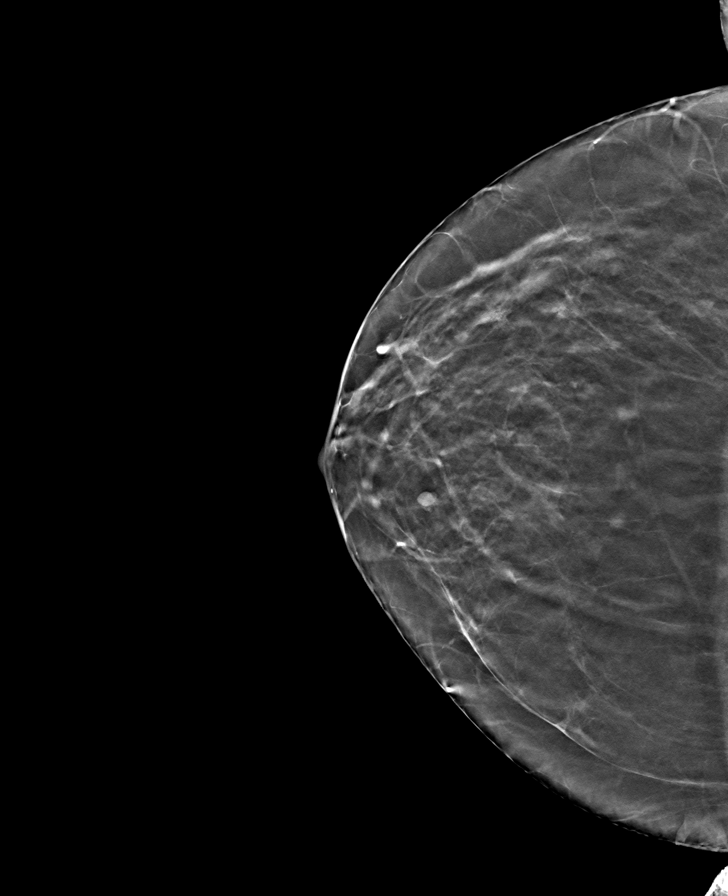

[8 of 24 positions shown; findings below may reference images not displayed]

ACR Breast Density Category b: There are scattered areas of
fibroglandular density.
FINDINGS: There are no findings suspicious for malignancy.
IMPRESSION: No mammographic evidence of malignancy. A result letter of this
screening mammogram will be mailed directly to the patient.

RECOMMENDATION:
Screening mammogram in one year. (Code:51-O-LD2)

BI-RADS CATEGORY  1: Negative.

## 2022-12-02 IMAGING — US US EXTREM LOW VENOUS*L*
1 series · 13 of 24 positions shown · non-contrast
Comparison: None

CLINICAL DATA: LEFT lower leg pain and swelling

EXAM:
LEFT LOWER EXTREMITY VENOUS DOPPLER ULTRASOUND
TECHNIQUE: Gray-scale sonography with compression, as well as color and duplex
ultrasound, were performed to evaluate the deep venous system(s)
from the level of the common femoral vein through the popliteal and
proximal calf veins.

[Series 1: us venous img lower uni left (dvt) · portal-venous · 13 of 40 slices shown]
[im 1/40]
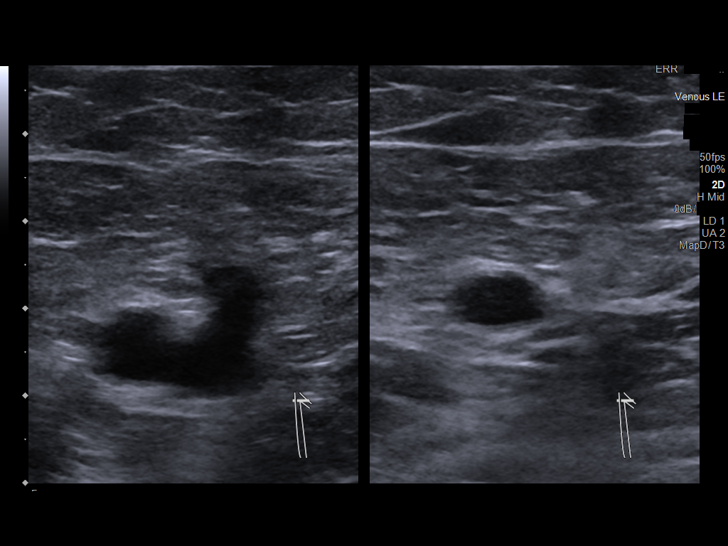
[im 4/40]
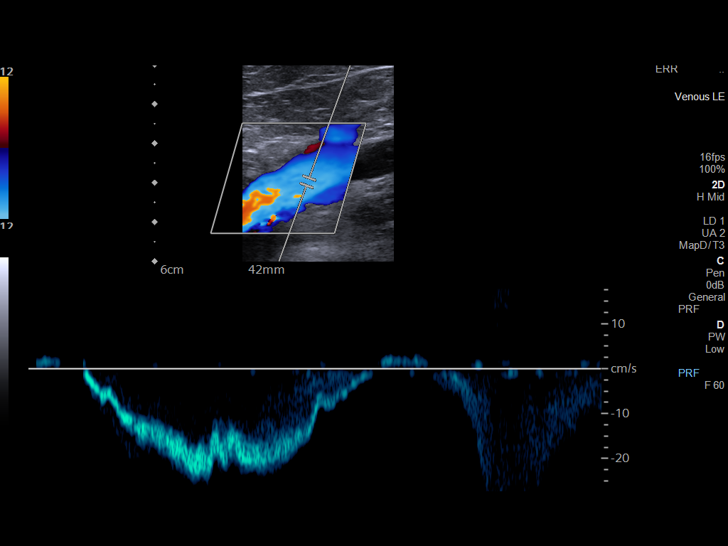
[im 7/40]
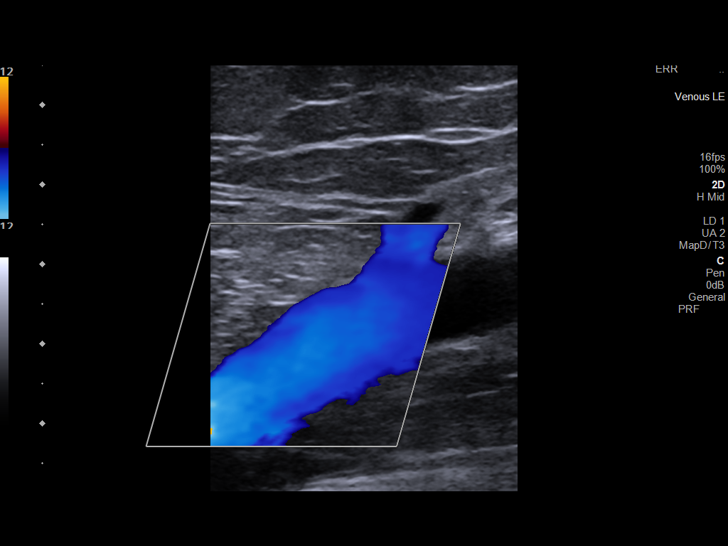
[im 11/40]
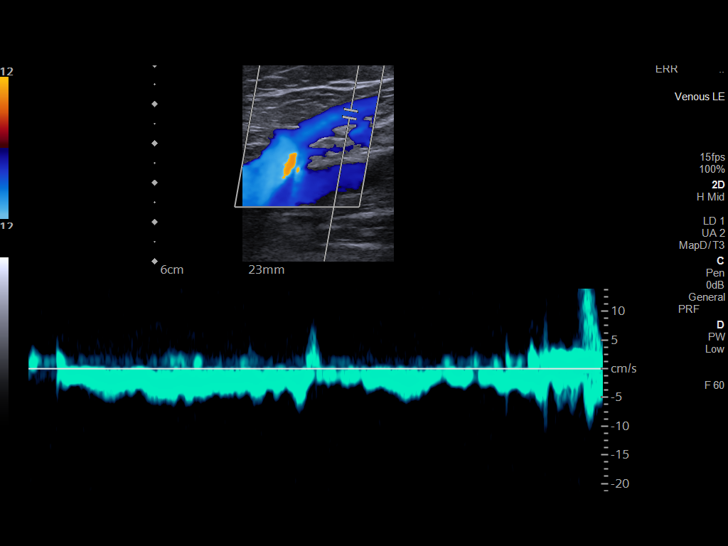
[im 14/40]
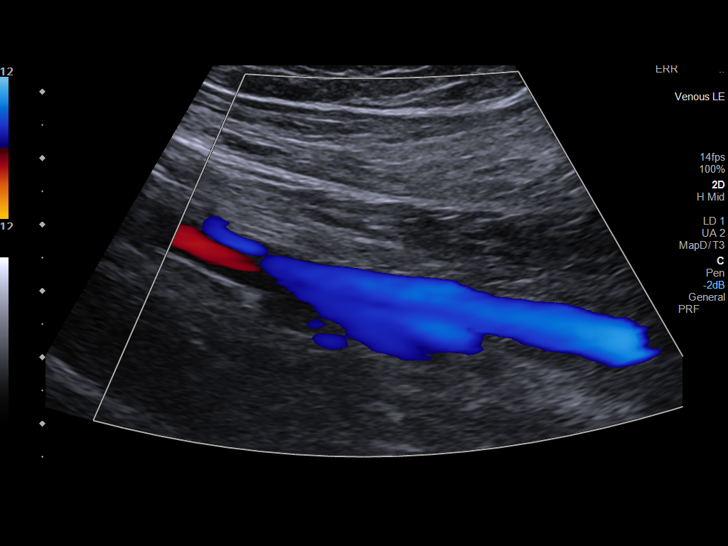
[im 17/40]
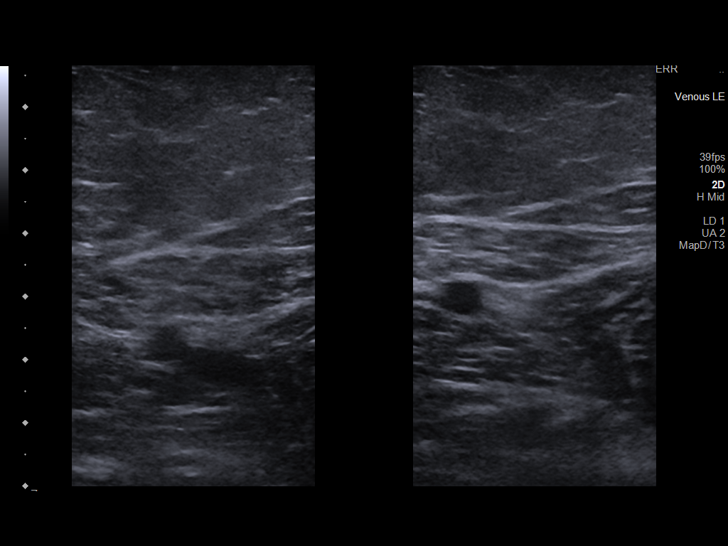
[im 21/40]
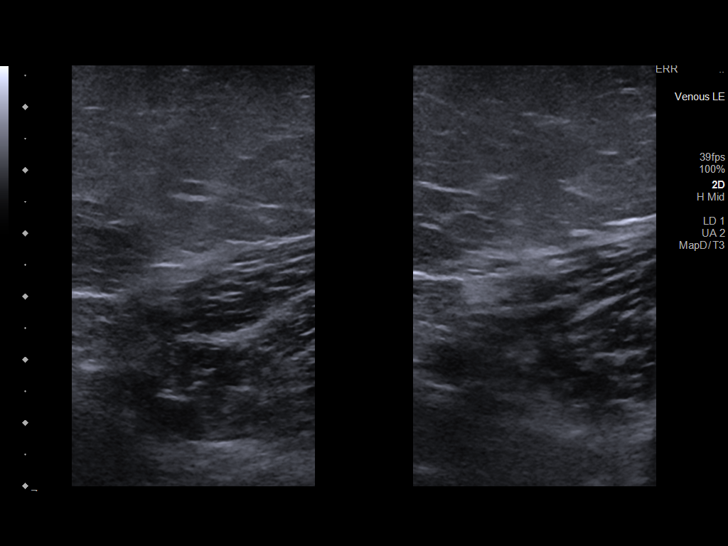
[im 23/40]
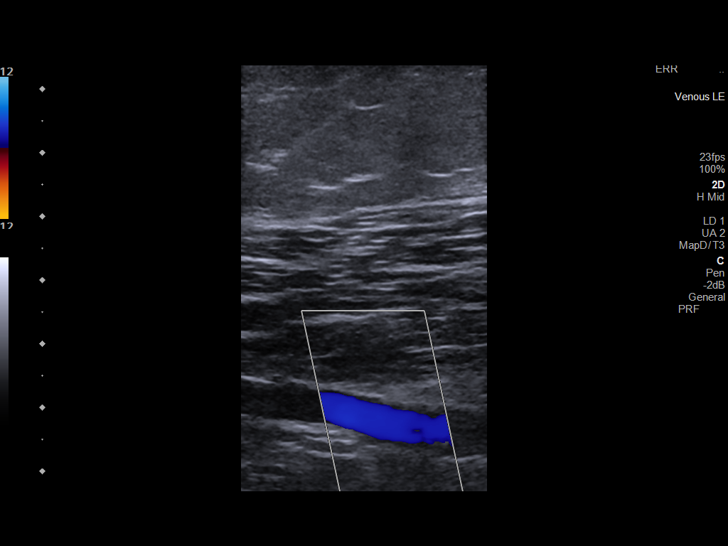
[im 26/40]
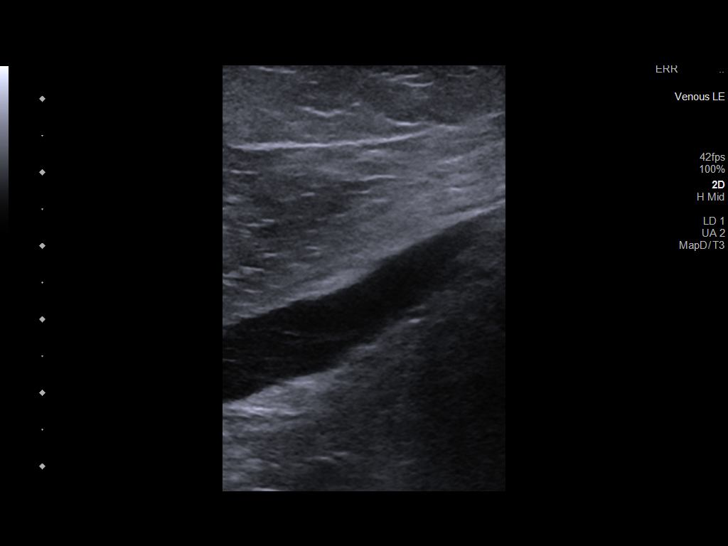
[im 29/40]
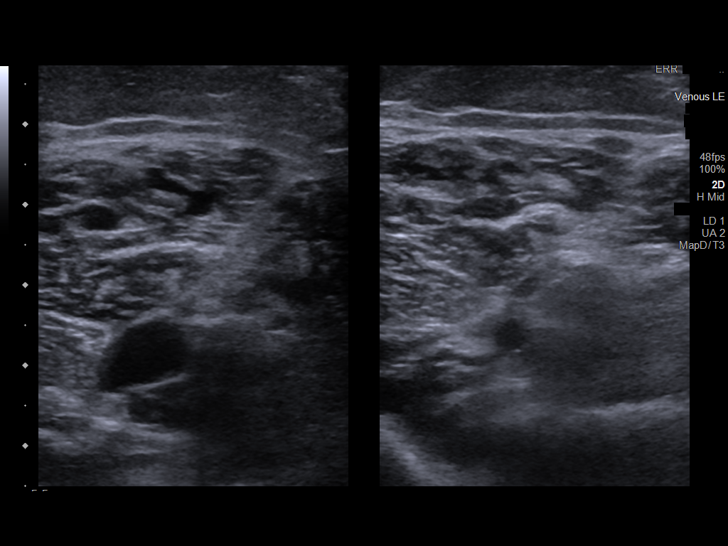
[im 33/40]
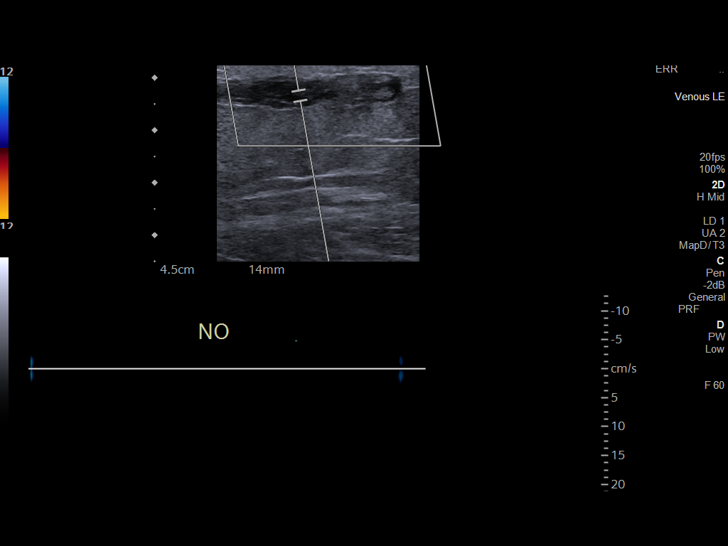
[im 36/40]
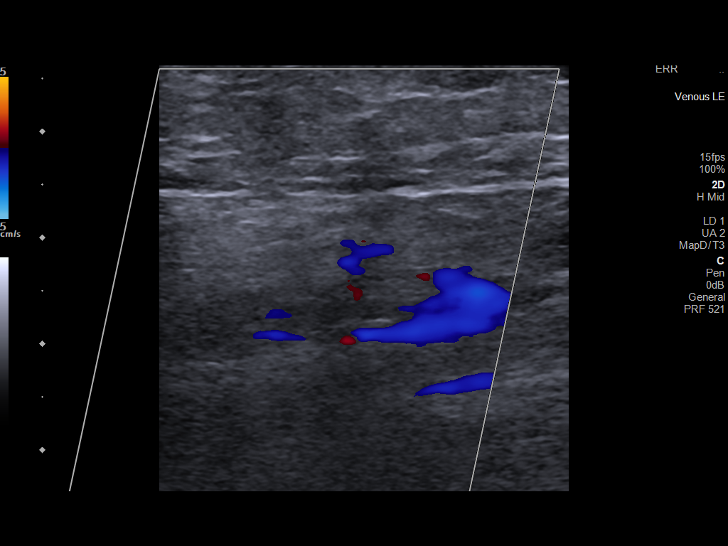
[im 40/40]
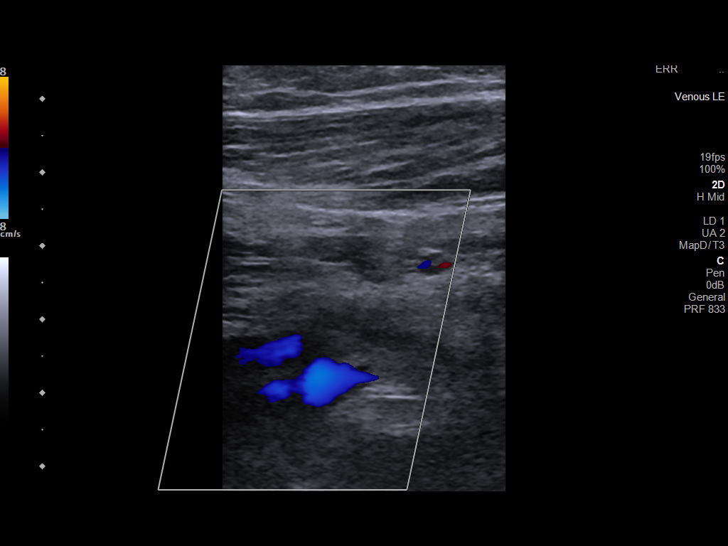

[13 of 24 positions shown; findings below may reference images not displayed]

FINDINGS: VENOUS

Normal compressibility of the common femoral, superficial femoral,
and popliteal veins, as well as the visualized calf veins.
Visualized portions of profunda femoral vein are unremarkable. No
filling defects to suggest DVT on grayscale or color Doppler
imaging. Doppler waveforms show normal direction of venous flow,
normal respiratory plasticity and response to augmentation.

Hypoechoic thrombus is identified within the LEFT greater saphenous
vein below the knee consistent with superficial thrombophlebitis.

Limited views of the contralateral common femoral vein are
unremarkable.

OTHER

None.

Limitations: none
IMPRESSION: No evidence of deep venous thrombosis in the LEFT lower extremity.

Positive exam for presence of superficial thrombophlebitis of the
LEFT greater saphenous vein below the knee.

## 2022-12-17 NOTE — Patient Instructions (Signed)
Janet Luna  12/17/2022     @PREFPERIOPPHARMACY @   Your procedure is scheduled on  12/21/2022.   Report to [facility] at  1100  A.M.   Call this number if you have problems the morning of surgery:  [phone]  If you experience any cold or flu symptoms such as cough, fever, chills, shortness of breath, etc. between now and your scheduled surgery, please notify us at the above number.   Remember:  Follow the diet and prep instructions given to you by the office.     Take these medicines the morning of surgery with A SIP OF WATER                               clonazepam, famotidine.     Do not wear jewelry, make-up or nail polish, including gel polish,  artificial nails, or any other type of covering on natural nails (fingers and  toes).  Do not wear lotions, powders, or perfumes, or deodorant.  Do not shave 48 hours prior to surgery.  Men may shave face and neck.  Do not bring valuables to the hospital.  [facility] is not responsible for any belongings or valuables.  Contacts, dentures or bridgework may not be worn into surgery.  Leave your suitcase in the car.  After surgery it may be brought to your room.  For patients admitted to the hospital, discharge time will be determined by your treatment team.  Patients discharged the day of surgery will not be allowed to drive home and must have someone with them for 24 hours.    Special instructions:   DO NOT smoke tobacco or vape for 24 hours before your procedure.  Please read over the following fact sheets that you were given. Anesthesia Post-op Instructions and Care and Recovery After Surgery      Colonoscopy, Adult, Care After The following information offers guidance on how to care for yourself after your procedure. Your health care provider may also give you more specific instructions. If you have problems or questions, contact your health care provider. What can I expect after the procedure? After the  procedure, it is common to have: A small amount of blood in your stool for 24 hours after the procedure. Some gas. Mild cramping or bloating of your abdomen. Follow these instructions at home: Eating and drinking  Drink enough fluid to keep your urine pale yellow. Follow instructions from your health care provider about eating or drinking restrictions. Resume your normal diet as told by your health care provider. Avoid heavy or fried foods that are hard to digest. Activity Rest as told by your health care provider. Avoid sitting for a long time without moving. Get up to take short walks every 1-2 hours. This is important to improve blood flow and breathing. Ask for help if you feel weak or unsteady. Return to your normal activities as told by your health care provider. Ask your health care provider what activities are safe for you. Managing cramping and bloating  Try walking around when you have cramps or feel bloated. If directed, apply heat to your abdomen as told by your health care provider. Use the heat source that your health care provider recommends, such as a moist heat pack or a heating pad. Place a towel between your skin and the heat source. Leave the heat on for 20-30 minutes. Remove the heat if  your skin turns bright red. This is especially important if you are unable to feel pain, heat, or cold. You have a greater risk of getting burned. General instructions If you were given a sedative during the procedure, it can affect you for several hours. Do not drive or operate machinery until your health care provider says that it is safe. For the first 24 hours after the procedure: Do not sign important documents. Do not drink alcohol. Do your regular daily activities at a slower pace than normal. Eat soft foods that are easy to digest. Take over-the-counter and prescription medicines only as told by your health care provider. Keep all follow-up visits. This is important. Contact  a health care provider if: You have blood in your stool 2-3 days after the procedure. Get help right away if: You have more than a small spotting of blood in your stool. You have large blood clots in your stool. You have swelling of your abdomen. You have nausea or vomiting. You have a fever. You have increasing pain in your abdomen that is not relieved with medicine. These symptoms may be an emergency. Get help right away. Call 911. Do not wait to see if the symptoms will go away. Do not drive yourself to the hospital. Summary After the procedure, it is common to have a small amount of blood in your stool. You may also have mild cramping and bloating of your abdomen. If you were given a sedative during the procedure, it can affect you for several hours. Do not drive or operate machinery until your health care provider says that it is safe. Get help right away if you have a lot of blood in your stool, nausea or vomiting, a fever, or increased pain in your abdomen. This information is not intended to replace advice given to you by your health care provider. Make sure you discuss any questions you have with your health care provider. Document Revised: 08/07/2022 Document Reviewed: 02/15/2021 Elsevier Patient Education  2024 [employer] Monitored Anesthesia Care, Care After The following information offers guidance on how to care for yourself after your procedure. Your health care provider may also give you more specific instructions. If you have problems or questions, contact your health care provider. What can I expect after the procedure? After the procedure, it is common to have: Tiredness. Little or no memory about what happened during or after the procedure. Impaired judgment when it comes to making decisions. Nausea or vomiting. Some trouble with balance. Follow these instructions at home: For the time period you were told by your health care provider:  Rest. Do not participate  in activities where you could fall or become injured. Do not drive or use machinery. Do not drink alcohol. Do not take sleeping pills or medicines that cause drowsiness. Do not make important decisions or sign legal documents. Do not take care of children on your own. Medicines Take over-the-counter and prescription medicines only as told by your health care provider. If you were prescribed antibiotics, take them as told by your health care provider. Do not stop using the antibiotic even if you start to feel better. Eating and drinking Follow instructions from your health care provider about what you may eat and drink. Drink enough fluid to keep your urine pale yellow. If you vomit: Drink clear fluids slowly and in small amounts as you are able. Clear fluids include water, ice chips, low-calorie sports drinks, and fruit juice that has water added to it (diluted  fruit juice). Eat light and bland foods in small amounts as you are able. These foods include bananas, applesauce, rice, lean meats, toast, and crackers. General instructions  Have a responsible adult stay with you for the time you are told. It is important to have someone help care for you until you are awake and alert. If you have sleep apnea, surgery and some medicines can increase your risk for breathing problems. Follow instructions from your health care provider about wearing your sleep device: When you are sleeping. This includes during daytime naps. While taking prescription pain medicines, sleeping medicines, or medicines that make you drowsy. Do not use any products that contain nicotine or tobacco. These products include cigarettes, chewing tobacco, and vaping devices, such as e-cigarettes. If you need help quitting, ask your health care provider. Contact a health care provider if: You feel nauseous or vomit every time you eat or drink. You feel light-headed. You are still sleepy or having trouble with balance after 24  hours. You get a rash. You have a fever. You have redness or swelling around the IV site. Get help right away if: You have trouble breathing. You have new confusion after you get home. These symptoms may be an emergency. Get help right away. Call 911. Do not wait to see if the symptoms will go away. Do not drive yourself to the hospital. This information is not intended to replace advice given to you by your health care provider. Make sure you discuss any questions you have with your health care provider. Document Revised: 11/20/2021 Document Reviewed: 11/20/2021 Elsevier Patient Education  2024 [employer].

## 2022-12-19 ENCOUNTER — Encounter (HOSPITAL_COMMUNITY): Payer: Self-pay

## 2022-12-19 ENCOUNTER — Encounter (HOSPITAL_COMMUNITY)
Admission: RE | Admit: 2022-12-19 | Discharge: 2022-12-19 | Disposition: A | Discharge status: Home / Self Care | Payer: Medicare Other | Source: Ambulatory Visit | Attending: Internal Medicine | Admitting: Internal Medicine

## 2022-12-19 VITALS — BP 146/66 | HR 57 | Temp 97.6°F | Resp 18 | Ht 64.0 in | Wt 229.9 lb

## 2022-12-19 DIAGNOSIS — Z01818 Encounter for other preprocedural examination: Secondary | ICD-10-CM | POA: Insufficient documentation

## 2022-12-19 DIAGNOSIS — D649 Anemia, unspecified: Secondary | ICD-10-CM | POA: Diagnosis not present

## 2022-12-19 HISTORY — DX: Prediabetes: R73.03

## 2022-12-19 LAB — CBC WITH DIFFERENTIAL/PLATELET
Abs Immature Granulocytes: 0.01 10*3/uL (ref 0.00–0.07)
Basophils Absolute: 0 10*3/uL (ref 0.0–0.1)
Basophils Relative: 1 %
Eosinophils Absolute: 0.1 10*3/uL (ref 0.0–0.5)
Eosinophils Relative: 1 %
HCT: 44.8 % (ref 36.0–46.0)
Hemoglobin: 13.9 g/dL (ref 12.0–15.0)
Immature Granulocytes: 0 %
Lymphocytes Relative: 25 %
Lymphs Abs: 1.7 10*3/uL (ref 0.7–4.0)
MCH: 25.7 pg — ABNORMAL LOW (ref 26.0–34.0)
MCHC: 31 g/dL (ref 30.0–36.0)
MCV: 83 fL (ref 80.0–100.0)
Monocytes Absolute: 0.5 10*3/uL (ref 0.1–1.0)
Monocytes Relative: 8 %
Neutro Abs: 4.6 10*3/uL (ref 1.7–7.7)
Neutrophils Relative %: 65 %
Platelets: 310 10*3/uL (ref 150–400)
RBC: 5.4 MIL/uL — ABNORMAL HIGH (ref 3.87–5.11)
RDW: 18 % — ABNORMAL HIGH (ref 11.5–15.5)
WBC: 7 10*3/uL (ref 4.0–10.5)
nRBC: 0 % (ref 0.0–0.2)

## 2022-12-19 LAB — GLUCOSE, CAPILLARY: Glucose-Capillary: 101 mg/dL — ABNORMAL HIGH (ref 70–99)

## 2022-12-21 ENCOUNTER — Other Ambulatory Visit: Payer: Self-pay

## 2022-12-21 ENCOUNTER — Ambulatory Visit (HOSPITAL_BASED_OUTPATIENT_CLINIC_OR_DEPARTMENT_OTHER): Payer: Medicare Other | Admitting: Anesthesiology

## 2022-12-21 ENCOUNTER — Encounter (HOSPITAL_COMMUNITY)
Admission: RE | Disposition: A | Discharge status: Home / Self Care | Payer: Self-pay | Source: Ambulatory Visit | Attending: Internal Medicine

## 2022-12-21 ENCOUNTER — Ambulatory Visit (HOSPITAL_COMMUNITY): Payer: Medicare Other | Admitting: Anesthesiology

## 2022-12-21 ENCOUNTER — Ambulatory Visit (HOSPITAL_COMMUNITY)
Admission: RE | Admit: 2022-12-21 | Discharge: 2022-12-21 | Disposition: A | Discharge status: Home / Self Care | Payer: Medicare Other | Source: Ambulatory Visit | Attending: Internal Medicine | Admitting: Internal Medicine

## 2022-12-21 ENCOUNTER — Encounter (HOSPITAL_COMMUNITY): Payer: Self-pay

## 2022-12-21 DIAGNOSIS — K635 Polyp of colon: Secondary | ICD-10-CM

## 2022-12-21 DIAGNOSIS — K648 Other hemorrhoids: Secondary | ICD-10-CM | POA: Insufficient documentation

## 2022-12-21 DIAGNOSIS — D123 Benign neoplasm of transverse colon: Secondary | ICD-10-CM

## 2022-12-21 DIAGNOSIS — Z1211 Encounter for screening for malignant neoplasm of colon: Secondary | ICD-10-CM | POA: Diagnosis present

## 2022-12-21 DIAGNOSIS — Z87891 Personal history of nicotine dependence: Secondary | ICD-10-CM | POA: Diagnosis not present

## 2022-12-21 DIAGNOSIS — Z86010 Personal history of colonic polyps: Secondary | ICD-10-CM

## 2022-12-21 DIAGNOSIS — K5730 Diverticulosis of large intestine without perforation or abscess without bleeding: Secondary | ICD-10-CM | POA: Diagnosis not present

## 2022-12-21 DIAGNOSIS — D124 Benign neoplasm of descending colon: Secondary | ICD-10-CM | POA: Diagnosis not present

## 2022-12-21 DIAGNOSIS — F419 Anxiety disorder, unspecified: Secondary | ICD-10-CM

## 2022-12-21 HISTORY — PX: COLONOSCOPY WITH PROPOFOL: SHX5780

## 2022-12-21 HISTORY — PX: POLYPECTOMY: SHX149

## 2022-12-21 LAB — GLUCOSE, CAPILLARY: Glucose-Capillary: 89 mg/dL (ref 70–99)

## 2022-12-21 SURGERY — COLONOSCOPY WITH PROPOFOL
Anesthesia: General

## 2022-12-21 MED ORDER — PROPOFOL 10 MG/ML IV BOLUS
INTRAVENOUS | Status: DC | PRN
  Administered 2022-12-21: 150 mg via INTRAVENOUS
  Administered 2022-12-21 (×3): 50 mg via INTRAVENOUS

## 2022-12-21 MED ORDER — LACTATED RINGERS IV SOLN: INTRAVENOUS | Status: DC

## 2022-12-21 NOTE — Anesthesia Postprocedure Evaluation (Signed)
Anesthesia Post Note  Patient: Janet Luna  Procedure(s) Performed: COLONOSCOPY WITH PROPOFOL POLYPECTOMY INTESTINAL  Patient location during evaluation: Phase II Anesthesia Type: General Level of consciousness: awake and alert and oriented Pain management: pain level controlled Vital Signs Assessment: post-procedure vital signs reviewed and stable Respiratory status: spontaneous breathing, nonlabored ventilation and respiratory function stable Cardiovascular status: blood pressure returned to baseline and stable Postop Assessment: no apparent nausea or vomiting Anesthetic complications: no  No notable events documented.   Last Vitals:  Vitals:   12/21/22 1142 12/21/22 1337  BP: (!) 142/54 114/64  Pulse: 60 66  Resp: 12 17  Temp: 36.7 C 36.7 C  SpO2: 99% 95%    Last Pain:  Vitals:   12/21/22 1337  TempSrc: Oral  PainSc: 0-No pain                 Amora Sheehy C Porche Steinberger

## 2022-12-21 NOTE — Anesthesia Preprocedure Evaluation (Addendum)
Anesthesia Evaluation  Patient identified by MRN, date of birth, ID band Patient awake    Reviewed: Allergy & Precautions, H&P , NPO status , Patient's Chart, lab work & pertinent test results  Airway Mallampati: II  TM Distance: >3 FB Neck ROM: Full    Dental no notable dental hx. (+) Teeth Intact, Dental Advisory Given   Pulmonary former smoker   Pulmonary exam normal breath sounds clear to auscultation       Cardiovascular negative cardio ROS  Rhythm:Regular Rate:Normal + Systolic murmurs [id number] 11:20:45 [facility] ROUTINE RECORD 1955-09-30 (67 yr) Female Caucasian Vent. rate 63 BPM PR interval 208 ms QRS duration 80 ms QT/QTcB 386/395 ms P-R-T axes 60 82 39 Normal sinus rhythm Normal ECG No previous ECGs available Confirmed by Carylon Perches [id number]) on 12/19/2022 9:07:40 PM   Neuro/Psych  PSYCHIATRIC DISORDERS Anxiety     negative neurological ROS     GI/Hepatic Neg liver ROS,GERD  Medicated and Controlled,,  Endo/Other  negative endocrine ROS    Renal/GU negative Renal ROS  negative genitourinary   Musculoskeletal negative musculoskeletal ROS (+)    Abdominal   Peds negative pediatric ROS (+)  Hematology negative hematology ROS (+)   Anesthesia Other Findings   Reproductive/Obstetrics negative OB ROS                             Anesthesia Physical Anesthesia Plan  ASA: 2  Anesthesia Plan: General   Post-op Pain Management: Minimal or no pain anticipated   Induction: Intravenous  PONV Risk Score and Plan: 1 and Propofol infusion and Treatment may vary due to age or medical condition  Airway Management Planned: Nasal Cannula and Natural Airway  Additional Equipment:   Intra-op Plan:   Post-operative Plan:   Informed Consent: I have reviewed the patients History and Physical, chart, labs and discussed the procedure including the risks,  benefits and alternatives for the proposed anesthesia with the patient or authorized representative who has indicated his/her understanding and acceptance.     Dental advisory given  Plan Discussed with: CRNA and Surgeon  Anesthesia Plan Comments:         Anesthesia Quick Evaluation

## 2022-12-21 NOTE — Discharge Instructions (Signed)
  Colonoscopy Discharge Instructions  Read the instructions outlined below and refer to this sheet in the next few weeks. These discharge instructions provide you with general information on caring for yourself after you leave the hospital. Your doctor may also give you specific instructions. While your treatment has been planned according to the most current medical practices available, unavoidable complications occasionally occur.   ACTIVITY You may resume your regular activity, but move at a slower pace for the next 24 hours.  Take frequent rest periods for the next 24 hours.  Walking will help get rid of the air and reduce the bloated feeling in your belly (abdomen).  No driving for 24 hours (because of the medicine (anesthesia) used during the test).   Do not sign any important legal documents or operate any machinery for 24 hours (because of the anesthesia used during the test).  NUTRITION Drink plenty of fluids.  You may resume your normal diet as instructed by your doctor.  Begin with a light meal and progress to your normal diet. Heavy or fried foods are harder to digest and may make you feel sick to your stomach (nauseated).  Avoid alcoholic beverages for 24 hours or as instructed.  MEDICATIONS You may resume your normal medications unless your doctor tells you otherwise.  WHAT YOU CAN EXPECT TODAY Some feelings of bloating in the abdomen.  Passage of more gas than usual.  Spotting of blood in your stool or on the toilet paper.  IF YOU HAD POLYPS REMOVED DURING THE COLONOSCOPY: No aspirin products for 7 days or as instructed.  No alcohol for 7 days or as instructed.  Eat a soft diet for the next 24 hours.  FINDING OUT THE RESULTS OF YOUR TEST Not all test results are available during your visit. If your test results are not back during the visit, make an appointment with your caregiver to find out the results. Do not assume everything is normal if you have not heard from your  caregiver or the medical facility. It is important for you to follow up on all of your test results.  SEEK IMMEDIATE MEDICAL ATTENTION IF: You have more than a spotting of blood in your stool.  Your belly is swollen (abdominal distention).  You are nauseated or vomiting.  You have a temperature over 101.  You have abdominal pain or discomfort that is severe or gets worse throughout the day.   Your colonoscopy revealed 3 small polyp(s) which I removed successfully. Await pathology results, my office will contact you. I do not think you need further colonoscopies for colon cancer screening.  You also have diverticulosis and internal hemorrhoids. I would recommend increasing fiber in your diet or adding OTC Benefiber/Metamucil. Be sure to drink at least 4 to 6 glasses of water daily. Follow-up with GI as needed.   I hope you have a great rest of your week!  Hennie Duos. Marletta Lor, D.O. Gastroenterology and Hepatology [facility]

## 2022-12-21 NOTE — Transfer of Care (Signed)
Immediate Anesthesia Transfer of Care Note  Patient: Janet Luna  Procedure(s) Performed: COLONOSCOPY WITH PROPOFOL POLYPECTOMY INTESTINAL  Patient Location: Short Stay  Anesthesia Type:General  Level of Consciousness: awake  Airway & Oxygen Therapy: Patient Spontanous Breathing  Post-op Assessment: Report given to RN and Post -op Vital signs reviewed and stable  Post vital signs: Reviewed and stable  Last Vitals:  Vitals Value Taken Time  BP 114/64 12/21/22 1337  Temp 36.7 C 12/21/22 1337  Pulse 66 12/21/22 1337  Resp 17 12/21/22 1337  SpO2 95 % 12/21/22 1337    Last Pain:  Vitals:   12/21/22 1337  TempSrc: Oral  PainSc: 0-No pain         Complications: No notable events documented.

## 2022-12-21 NOTE — H&P (Signed)
Primary Care Physician:  Gareth Morgan, MD Primary Gastroenterologist:  Dr. Marletta Lor  Pre-Procedure History & Physical: HPI:  Janet Luna is a 67 y.o. female is here for a colonoscopy for colon cancer screening purposes.  Patient denies any family history of colorectal cancer.  No melena or hematochezia.  No abdominal pain or unintentional weight loss.  No change in bowel habits.  Overall feels well from a GI standpoint.  Past Medical History:  Diagnosis Date   Allergic rhinitis    Anxiety    GERD (gastroesophageal reflux disease)    Insomnia    Morbid obesity (HCC)    Pre-diabetes     Past Surgical History:  Procedure Laterality Date   CESAREAN SECTION     twin 28 yrs ago   COLONOSCOPY  08/17/2011   Procedure: COLONOSCOPY;  Surgeon: Arlyce Harman, MD;  Location: [facility];  Service: Endoscopy;  Laterality: N/A;  1:15 PM   TONSILLECTOMY     age 24    Prior to Admission medications   Medication Sig Start Date End Date Taking? Authorizing Provider  clonazePAM (KLONOPIN) 0.5 MG tablet Take 0.5 mg by mouth 3 (three) times daily.   Yes [provider]  cyanocobalamin (VITAMIN B12) 1000 MCG tablet Take 2,000 mcg by mouth daily.   Yes [provider]  famotidine (PEPCID) 20 MG tablet Take 20 mg by mouth daily as needed for heartburn or indigestion.   Yes [provider]  ferrous sulfate 325 (65 FE) MG tablet Take 325 mg by mouth every evening.   Yes [provider]  ibuprofen (ADVIL,MOTRIN) 200 MG tablet Take 200 mg by mouth every 6 (six) hours as needed for moderate pain. For pain   Yes [provider]  traZODone (DESYREL) 100 MG tablet Take 100 mg by mouth at bedtime.   Yes [provider]  albuterol (VENTOLIN HFA) 108 (90 Base) MCG/ACT inhaler Inhale 1-2 puffs into the lungs every 6 (six) hours as needed for wheezing or shortness of breath. 08/24/22   [provider]    Allergies as of 11/20/2022 - Review Complete  05/25/2013  Allergen Reaction Noted   Quinolones Hives 05/25/2013   Avelox [moxifloxacin hcl in nacl] Hives 05/25/2013   Levaquin [levofloxacin in d5w]  05/25/2013    Family History  Problem Relation Age of Onset   Colon cancer Neg Hx    Colon polyps Neg Hx     Social History   Socioeconomic History   Marital status: Divorced    Spouse name: Not on file   Number of children: Not on file   Years of education: Not on file   Highest education level: Not on file  Occupational History   Not on file  Tobacco Use   Smoking status: Former    Packs/day: 0.50    Years: 12.00    Additional pack years: 0.00    Total pack years: 6.00    Types: Cigarettes   Smokeless tobacco: Not on file   Tobacco comments:    quit about 20 yrs ago  Substance and Sexual Activity   Alcohol use: No   Drug use: No   Sexual activity: Not on file  Other Topics Concern   Not on file  Social History Narrative   Not on file   Social Determinants of Health   Financial Resource Strain: Not on file  Food Insecurity: Not on file  Transportation Needs: Not on file  Physical Activity: Not on file  Stress:  Not on file  Social Connections: Not on file  Intimate Partner Violence: Not on file    Review of Systems: See HPI, otherwise negative ROS  Physical Exam: Vital signs in last 24 hours: Temp:  [98.1 F (36.7 C)] 98.1 F (36.7 C) (06/14 1142) Pulse Rate:  [60] 60 (06/14 1142) Resp:  [12] 12 (06/14 1142) BP: (142)/(54) 142/54 (06/14 1142) SpO2:  [99 %] 99 % (06/14 1142) Weight:  [104.3 kg] 104.3 kg (06/14 1142)   General:   Alert,  Well-developed, well-nourished, pleasant and cooperative in NAD Head:  Normocephalic and atraumatic. Eyes:  Sclera clear, no icterus.   Conjunctiva pink. Ears:  Normal auditory acuity. Nose:  No deformity, discharge,  or lesions. Msk:  Symmetrical without gross deformities. Normal posture. Extremities:  Without clubbing or edema. Neurologic:  Alert and  oriented  x4;  grossly normal neurologically. Skin:  Intact without significant lesions or rashes. Psych:  Alert and cooperative. Normal mood and affect.  Impression/Plan: Janet Luna is here for a colonoscopy to be performed for colon cancer screening purposes.  The risks of the procedure including infection, bleed, or perforation as well as benefits, limitations, alternatives and imponderables have been reviewed with the patient. Questions have been answered. All parties agreeable.

## 2022-12-21 NOTE — Anesthesia Procedure Notes (Signed)
Date/Time: 12/21/2022 1:13 PM  Performed by: Franco Nones, CRNAPre-anesthesia Checklist: Patient identified, Emergency Drugs available, Suction available, Timeout performed and Patient being monitored Patient Re-evaluated:Patient Re-evaluated prior to induction Oxygen Delivery Method: Non-rebreather mask

## 2022-12-21 NOTE — Op Note (Signed)
[facility] Patient Name: Janet Luna Procedure Date: 12/21/2022 12:53 PM MRN: [id number] Date of Birth: 1956-04-11 Attending MD: Hennie Duos. Marletta Lor , [place], [id number] CSN: [id number] Age: 67 Admit Type: Outpatient Procedure:                Colonoscopy Indications:              Screening for colorectal malignant neoplasm Providers:                Hennie Duos. Marletta Lor, DO, Sheran Fava, Lennice Sites Technician, Technician Referring MD:              Medicines:                See the Anesthesia note for documentation of the                            administered medications Complications:            No immediate complications. Estimated Blood Loss:     Estimated blood loss was minimal. Procedure:                Pre-Anesthesia Assessment:                           - The anesthesia plan was to use monitored                            anesthesia care (MAC).                           After obtaining informed consent, the colonoscope                            was passed under direct vision. Throughout the                            procedure, the patient's blood pressure, pulse, and                            oxygen saturations were monitored continuously. The                            PCF-HQ190L ([id number]) scope was introduced through                            the anus and advanced to the the cecum, identified                            by appendiceal orifice and ileocecal valve. The                            colonoscopy was performed without difficulty. The                            patient tolerated the procedure well.  The quality                            of the bowel preparation was evaluated using the                            BBPS [facility] Bowel Preparation Scale) with scores                            of: Right Colon = 2 (minor amount of residual                            staining, small fragments of stool and/or opaque                             liquid, but mucosa seen well), Transverse Colon = 2                            (minor amount of residual staining, small fragments                            of stool and/or opaque liquid, but mucosa seen                            well) and Left Colon = 3 (entire mucosa seen well                            with no residual staining, small fragments of stool                            or opaque liquid). The total BBPS score equals 7.                            The quality of the bowel preparation was good. Scope In: 1:12:35 PM Scope Out: 1:32:07 PM Scope Withdrawal Time: 0 hours 14 minutes 58 seconds  Total Procedure Duration: 0 hours 19 minutes 32 seconds  Findings:      Non-bleeding internal hemorrhoids were found during endoscopy.      Many large-mouthed and small-mouthed diverticula were found in the       entire colon.      A 2 mm polyp was found in the transverse colon. The polyp was sessile.       The polyp was removed with a cold biopsy forceps. Resection and       retrieval were complete.      Two sessile polyps were found in the descending colon. The polyps were 3       to 4 mm in size. These polyps were removed with a cold snare. Resection       and retrieval were complete. Impression:               - Non-bleeding internal hemorrhoids.                           - Diverticulosis in the entire examined colon.                           -  One 2 mm polyp in the transverse colon, removed                            with a cold biopsy forceps. Resected and retrieved.                           - One 4 mm polyp in the descending colon, removed                            with a cold snare. Resected and retrieved.                           - The examination was otherwise normal. Moderate Sedation:      Per Anesthesia Care Recommendation:           - Patient has a contact number available for                            emergencies. The signs and symptoms of potential                             delayed complications were discussed with the                            patient. Return to normal activities tomorrow.                            Written discharge instructions were provided to the                            patient.                           - Resume previous diet.                           - Continue present medications.                           - Await pathology results.                           - No repeat colonoscopy due to age.                           - Return to GI clinic PRN. Procedure Code(s):        --- Professional ---                           [phone], Colonoscopy, flexible; with removal of                            tumor(s), polyp(s), or other lesion(s) by snare                            technique  16109, 59, Colonoscopy, flexible; with biopsy,                            single or multiple Diagnosis Code(s):        --- Professional ---                           Z12.11, Encounter for screening for malignant                            neoplasm of colon                           K64.8, Other hemorrhoids                           D12.3, Benign neoplasm of transverse colon (hepatic                            flexure or splenic flexure)                           D12.4, Benign neoplasm of descending colon                           K57.30, Diverticulosis of large intestine without                            perforation or abscess without bleeding CPT copyright 2022 [employer]. All rights reserved. The codes documented in this report are preliminary and upon coder review may  be revised to meet current compliance requirements. Hennie Duos. Marletta Lor, DO Hennie Duos. Marletta Lor, DO 12/21/2022 1:34:36 PM This report has been signed electronically. Number of Addenda: 0

## 2022-12-21 NOTE — Anesthesia Procedure Notes (Signed)
Date/Time: 12/21/2022 1:07 PM  Performed by: Franco Nones, CRNAPre-anesthesia Checklist: Patient identified, Emergency Drugs available, Suction available, Timeout performed and Patient being monitored Patient Re-evaluated:Patient Re-evaluated prior to induction Oxygen Delivery Method: Nasal Cannula

## 2022-12-24 ENCOUNTER — Other Ambulatory Visit (HOSPITAL_COMMUNITY): Payer: Medicare Other

## 2022-12-25 LAB — SURGICAL PATHOLOGY

## 2022-12-27 ENCOUNTER — Encounter (HOSPITAL_COMMUNITY): Payer: Self-pay | Admitting: Internal Medicine

## 2023-03-18 IMAGING — US US EXTREM LOW VENOUS*L*
1 series · 13 of 24 positions shown · non-contrast
Comparison: None.

CLINICAL DATA: 65-year-old female with history of left superficial
thrombophlebitis.

EXAM:
LEFT LOWER EXTREMITY VENOUS DOPPLER ULTRASOUND
TECHNIQUE: Gray-scale sonography with graded compression, as well as color
Doppler and duplex ultrasound were performed to evaluate the left
lower extremity deep venous systems from the level of the common
femoral vein and including the common femoral, femoral, profunda
femoral, popliteal and calf veins including the posterior tibial,
peroneal and gastrocnemius veins when visible. Spectral Doppler was
utilized to evaluate flow at rest and with distal augmentation
maneuvers in the common femoral, femoral and popliteal veins. The
contralateral common femoral vein was also evaluated for comparison.

[Series 1: us extrem low venous*left* · 0.08mm/px · 13 of 36 slices shown]
[im 1/36]
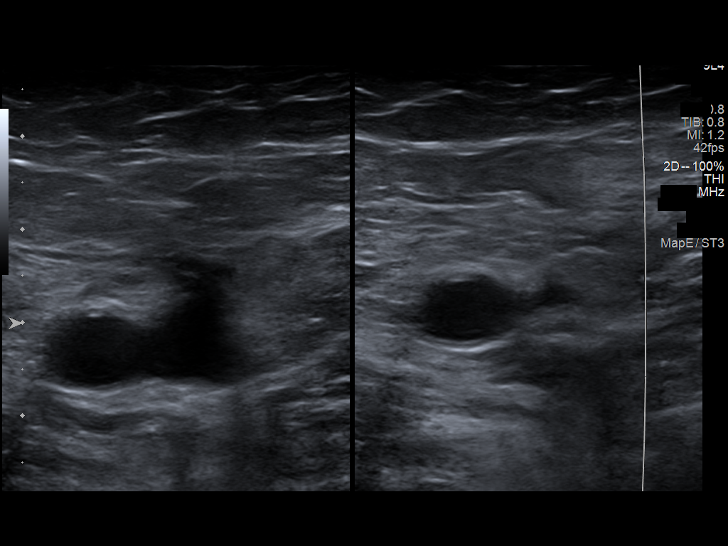
[im 4/36]
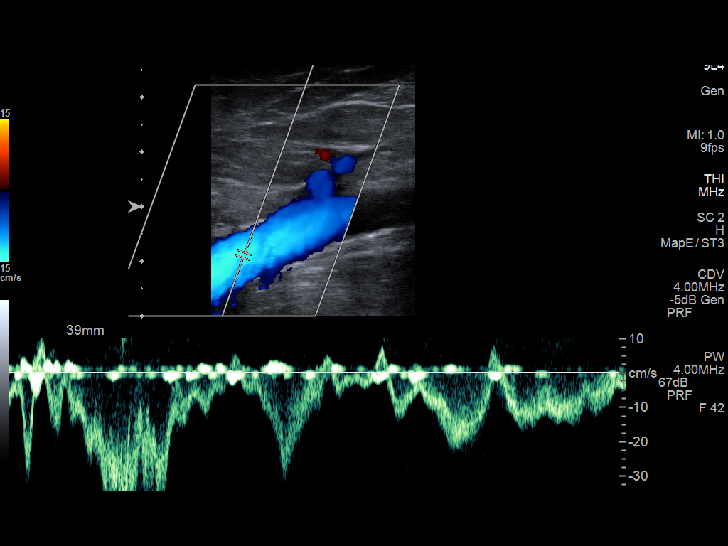
[im 7/36]
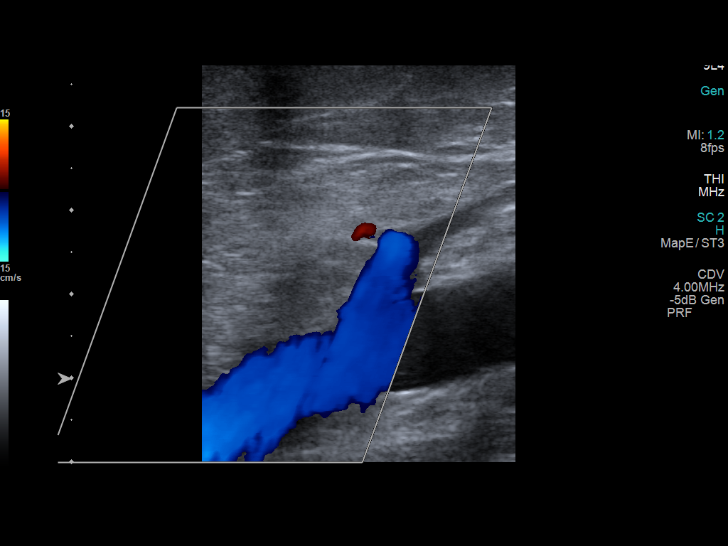
[im 10/36]
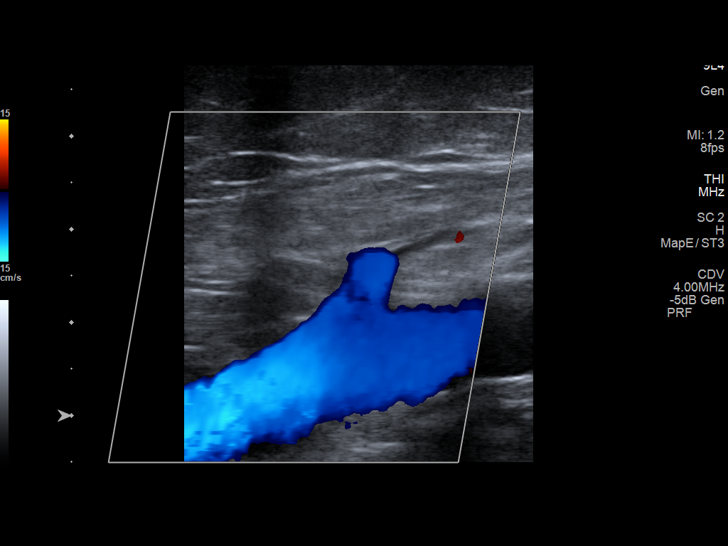
[im 13/36]
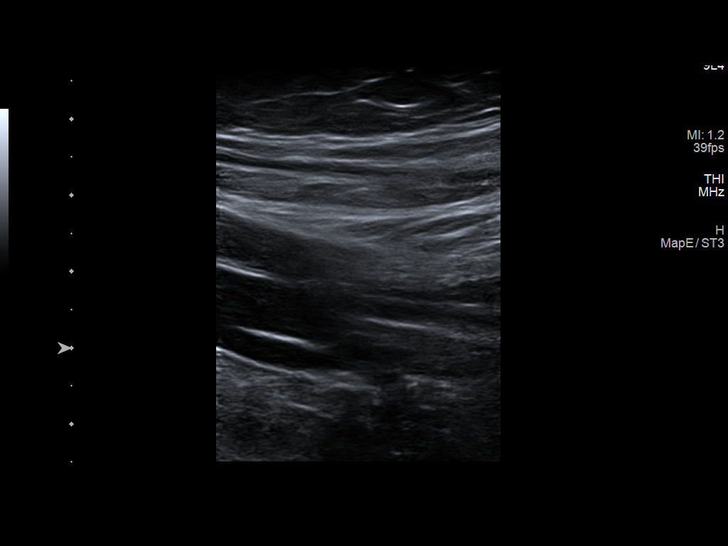
[im 16/36]
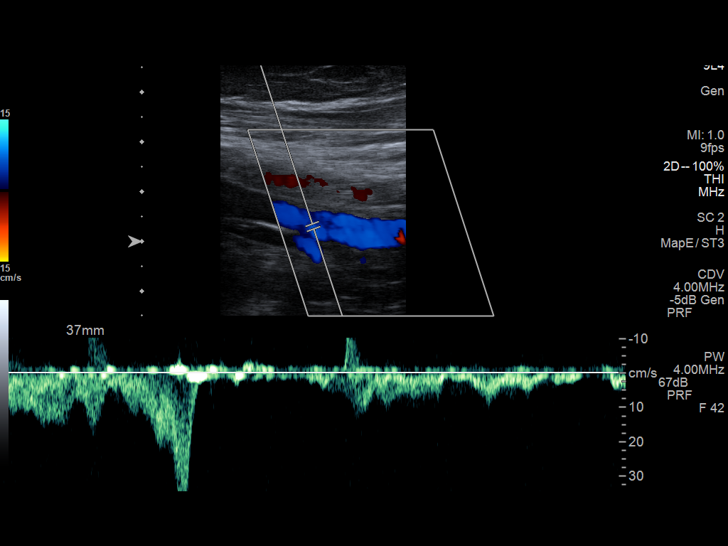
[im 19/36]
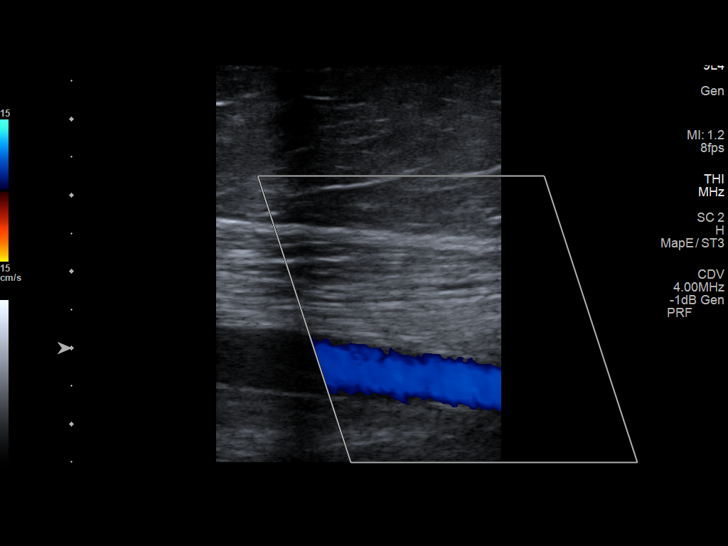
[im 20/36]
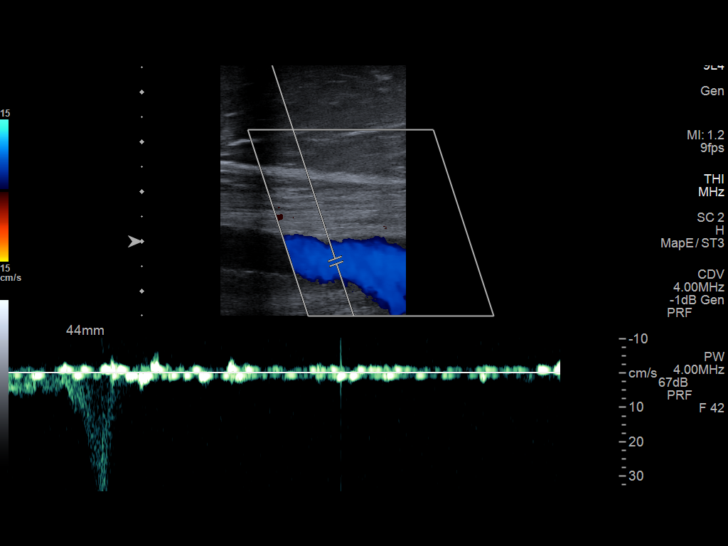
[im 23/36]
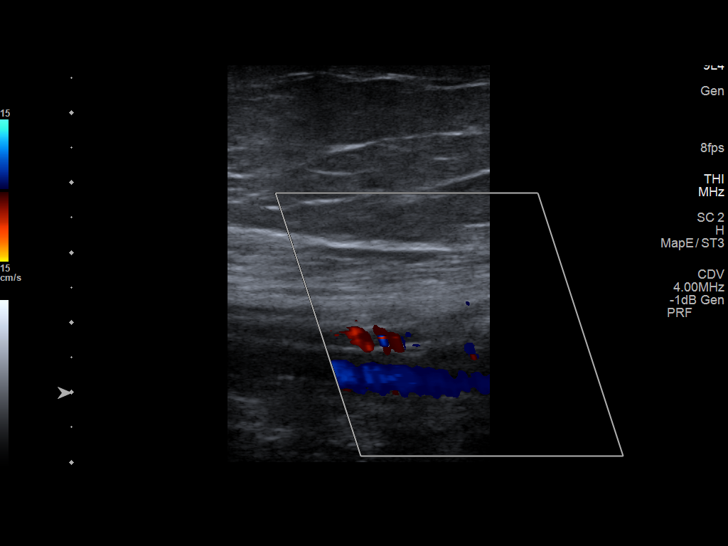
[im 26/36]
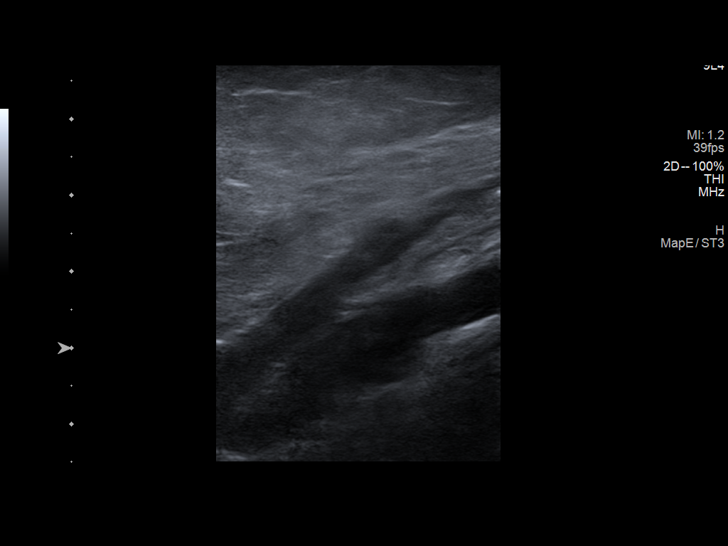
[im 29/36]
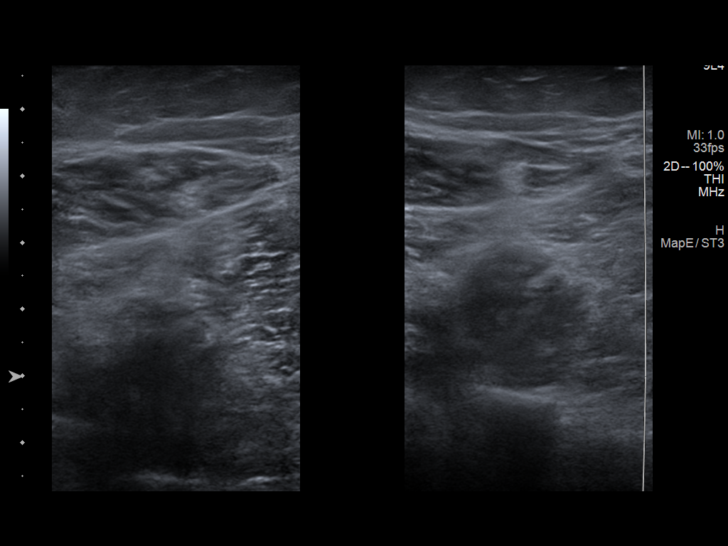
[im 32/36]
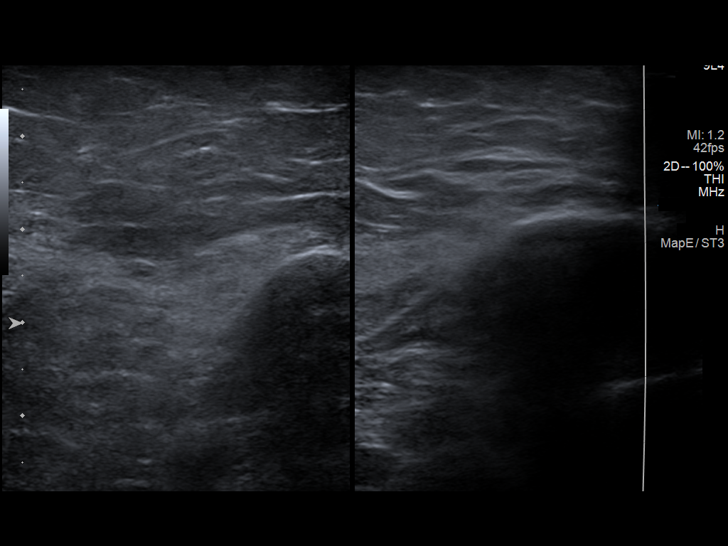
[im 36/36]
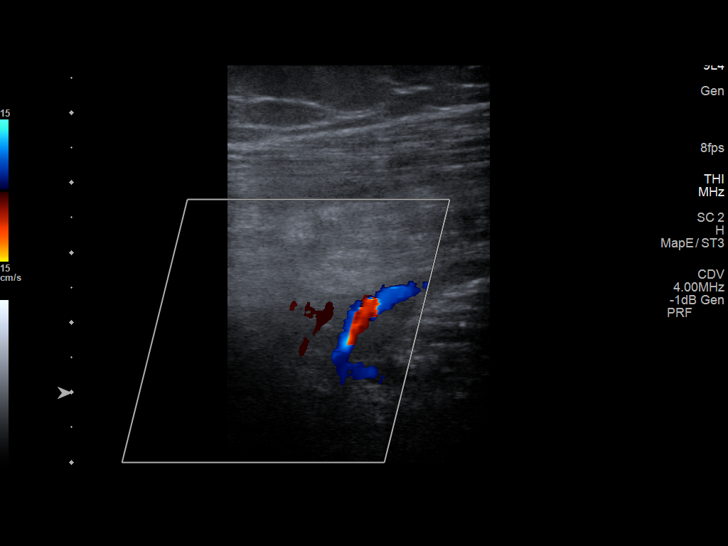

[13 of 24 positions shown; findings below may reference images not displayed]

FINDINGS: LEFT LOWER EXTREMITY

Common Femoral Vein: No evidence of thrombus. Normal
compressibility, respiratory phasicity and response to augmentation.

Central Greater Saphenous Vein: No evidence of thrombus. Normal
compressibility and flow on color Doppler imaging.

Central Profunda Femoral Vein: No evidence of thrombus. Normal
compressibility and flow on color Doppler imaging.

Femoral Vein: No evidence of thrombus. Normal compressibility,
respiratory phasicity and response to augmentation.

Popliteal Vein: No evidence of thrombus. Normal compressibility,
respiratory phasicity and response to augmentation.

Calf Veins: No evidence of thrombus. Normal compressibility and flow
on color Doppler imaging.

Other Findings:  None.

RIGHT LOWER EXTREMITY

Common Femoral Vein: No evidence of thrombus. Normal
compressibility, respiratory phasicity and response to augmentation.
IMPRESSION: No evidence of left lower extremity deep or superficial venous
thrombosis.

## 2023-10-10 ENCOUNTER — Other Ambulatory Visit (HOSPITAL_COMMUNITY): Payer: Self-pay | Admitting: Family Medicine

## 2023-10-10 DIAGNOSIS — Z1231 Encounter for screening mammogram for malignant neoplasm of breast: Secondary | ICD-10-CM

## 2023-10-28 ENCOUNTER — Ambulatory Visit (HOSPITAL_COMMUNITY)
Admission: RE | Admit: 2023-10-28 | Discharge: 2023-10-28 | Disposition: A | Discharge status: Home / Self Care | Source: Ambulatory Visit | Attending: Family Medicine | Admitting: Family Medicine

## 2023-10-28 ENCOUNTER — Encounter (HOSPITAL_COMMUNITY): Payer: Self-pay

## 2023-10-28 DIAGNOSIS — Z1231 Encounter for screening mammogram for malignant neoplasm of breast: Secondary | ICD-10-CM | POA: Diagnosis present
